# Patient Record
Sex: Female | Born: 1975 | Race: White | Hispanic: No | Marital: Married | Smoking: Never smoker
Health system: Southern US, Community
[De-identification: ages and names within clinical notes are randomized; demographics above are authoritative.]

## PROBLEM LIST (undated history)

## (undated) DIAGNOSIS — F419 Anxiety disorder, unspecified: Secondary | ICD-10-CM

## (undated) DIAGNOSIS — F909 Attention-deficit hyperactivity disorder, unspecified type: Secondary | ICD-10-CM

## (undated) DIAGNOSIS — I499 Cardiac arrhythmia, unspecified: Secondary | ICD-10-CM

## (undated) HISTORY — PX: ABDOMINAL HYSTERECTOMY: SHX81

## (undated) HISTORY — PX: WISDOM TOOTH EXTRACTION: SHX21

## (undated) HISTORY — DX: Attention-deficit hyperactivity disorder, unspecified type: F90.9

## (undated) HISTORY — DX: Anxiety disorder, unspecified: F41.9

## (undated) HISTORY — PX: TOTAL ABDOMINAL HYSTERECTOMY: SHX209

---

## 2004-01-22 ENCOUNTER — Other Ambulatory Visit: Admission: RE | Admit: 2004-01-22 | Discharge: 2004-01-22 | Payer: Self-pay | Admitting: Obstetrics and Gynecology

## 2004-09-03 ENCOUNTER — Ambulatory Visit (HOSPITAL_COMMUNITY): Admission: RE | Admit: 2004-09-03 | Discharge: 2004-09-03 | Payer: Self-pay | Admitting: Obstetrics and Gynecology

## 2005-02-01 ENCOUNTER — Inpatient Hospital Stay (HOSPITAL_COMMUNITY): Admission: AD | Admit: 2005-02-01 | Discharge: 2005-02-01 | Payer: Self-pay | Admitting: Obstetrics & Gynecology

## 2005-04-19 ENCOUNTER — Inpatient Hospital Stay (HOSPITAL_COMMUNITY): Admission: AD | Admit: 2005-04-19 | Discharge: 2005-04-20 | Payer: Self-pay | Admitting: Obstetrics and Gynecology

## 2005-04-20 ENCOUNTER — Inpatient Hospital Stay (HOSPITAL_COMMUNITY): Admission: AD | Admit: 2005-04-20 | Discharge: 2005-04-22 | Payer: Self-pay | Admitting: Obstetrics and Gynecology

## 2005-04-20 ENCOUNTER — Encounter (INDEPENDENT_AMBULATORY_CARE_PROVIDER_SITE_OTHER): Payer: Self-pay | Admitting: Specialist

## 2006-10-15 IMAGING — US US FETAL BPP W/O NONSTRESS
1 series · 14 of 20 positions shown · non-contrast
Comparison: none

CLINICAL DATA: 26 weeks and 5 days pregnant with no detectable fetal movement for the past 2.5 days.

[Series 1: us fetal bpp w/o nonstress · 0.33mm/px · 14 of 20 slices shown]
[im 1/20]
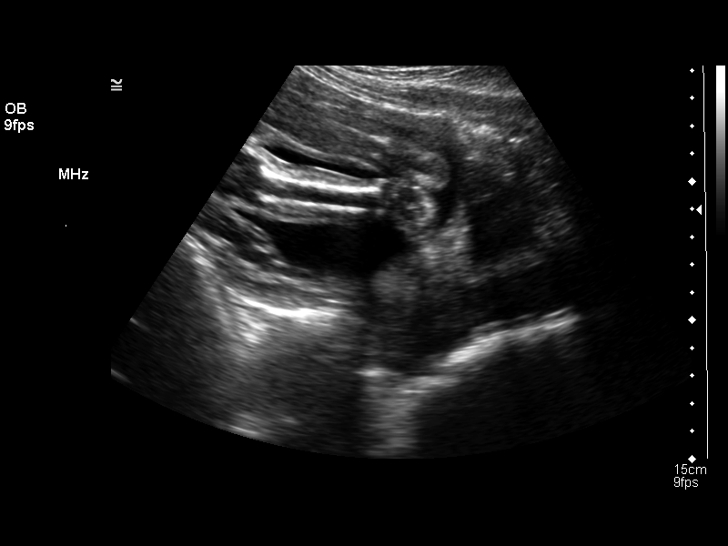
[im 3/20]
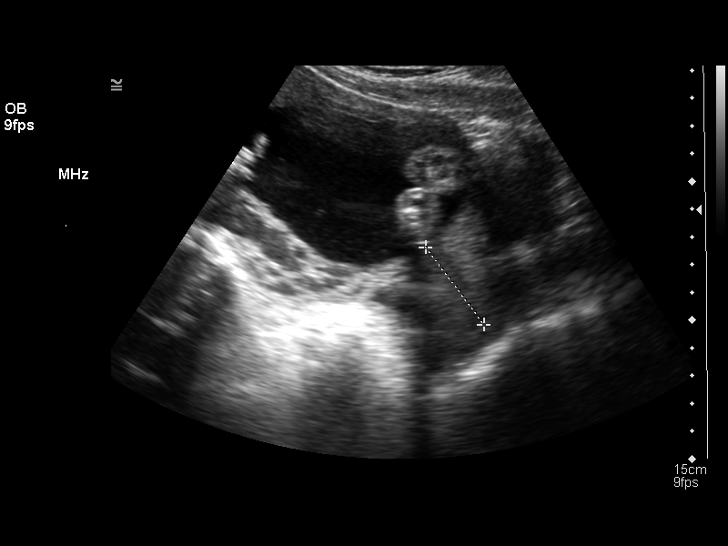
[im 4/20]
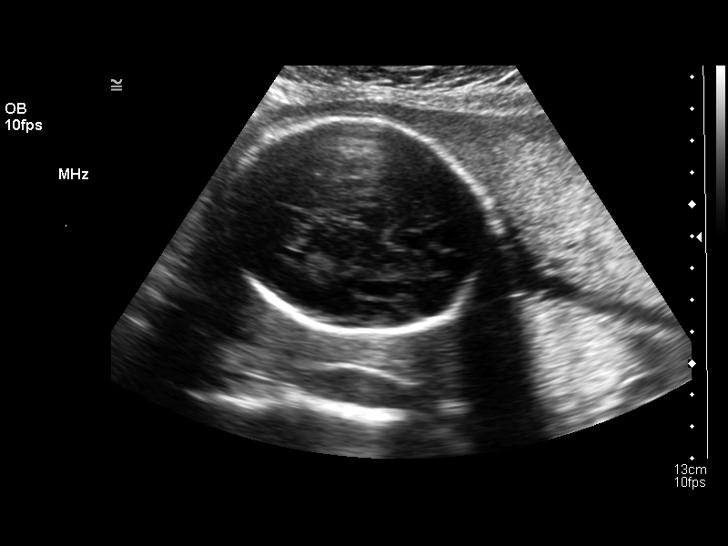
[im 6/20]
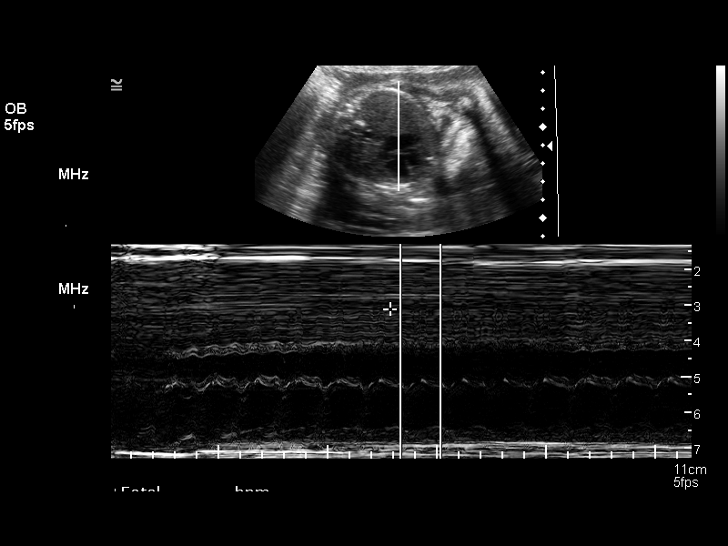
[im 7/20]
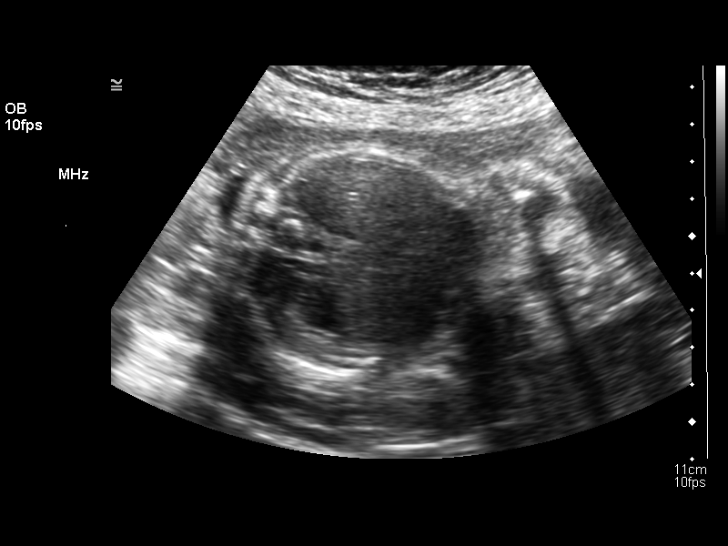
[im 8/20]
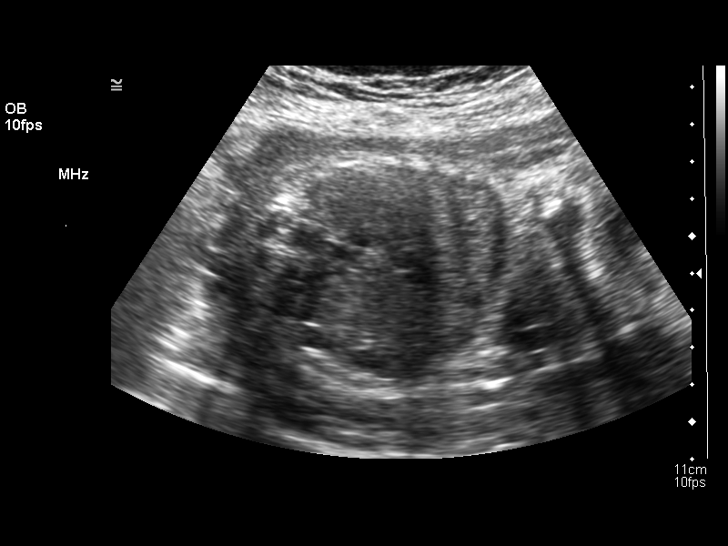
[im 10/20]
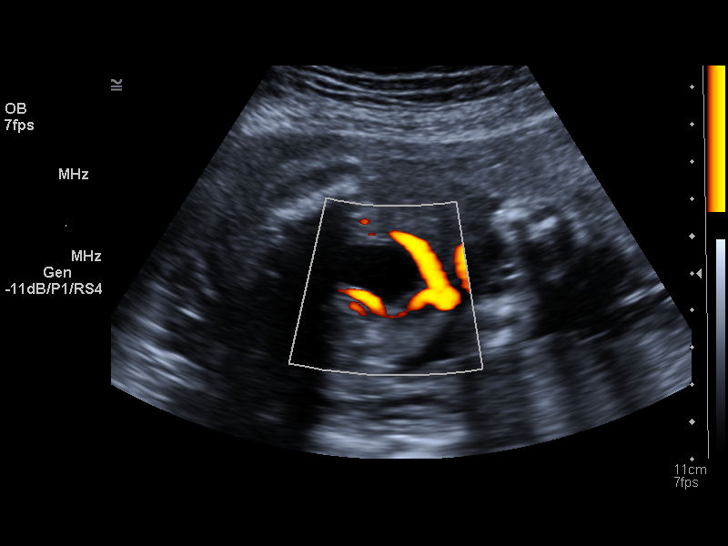
[im 11/20]
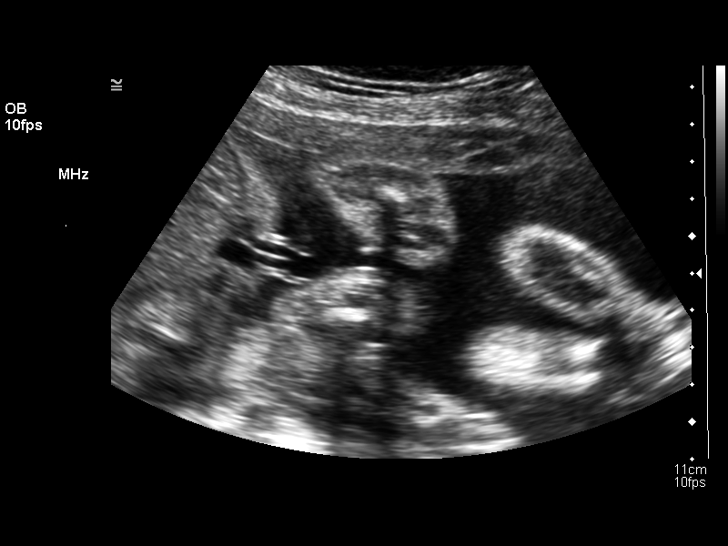
[im 13/20]
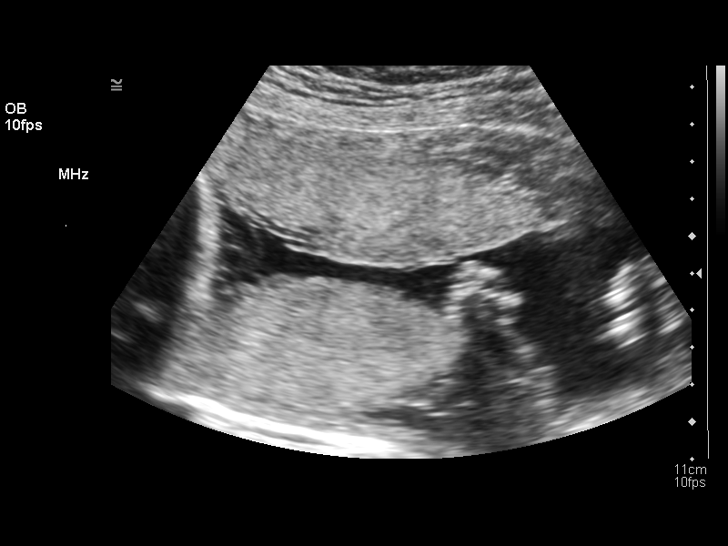
[im 14/20]
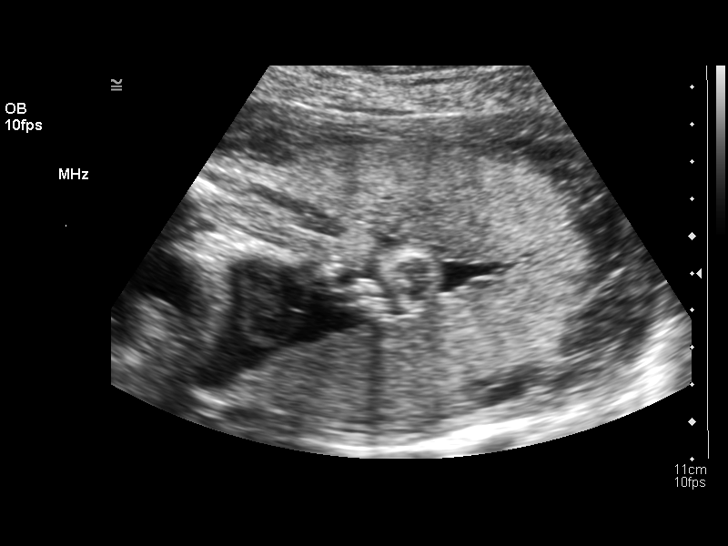
[im 16/20]
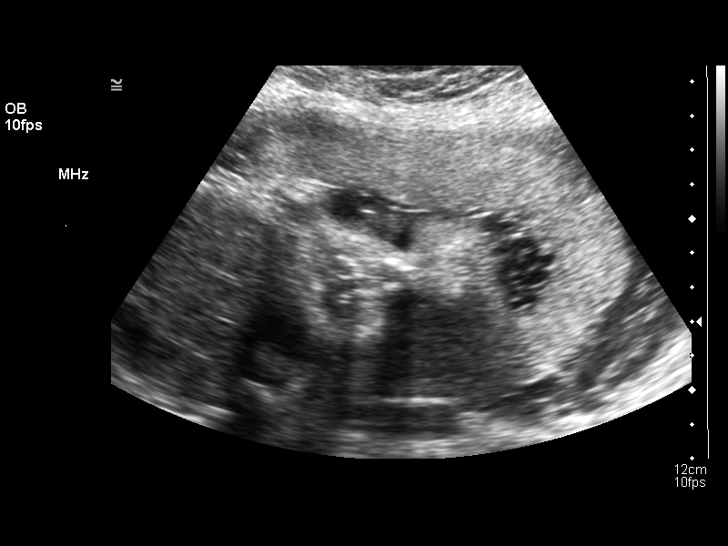
[im 17/20]
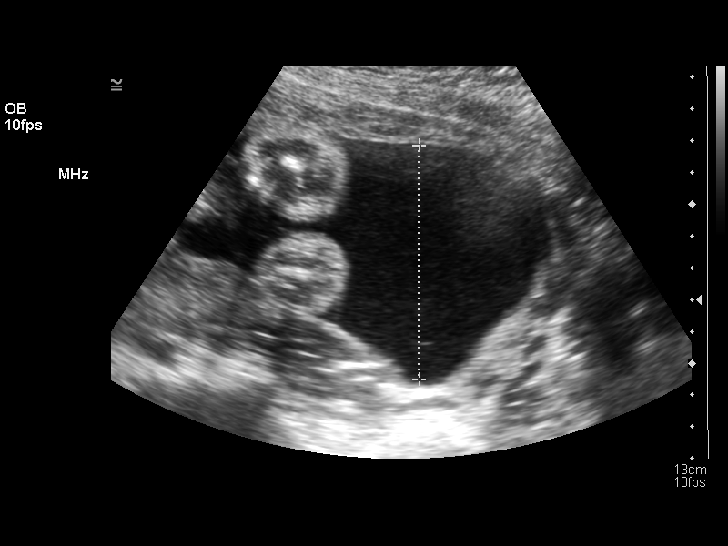
[im 18/20]
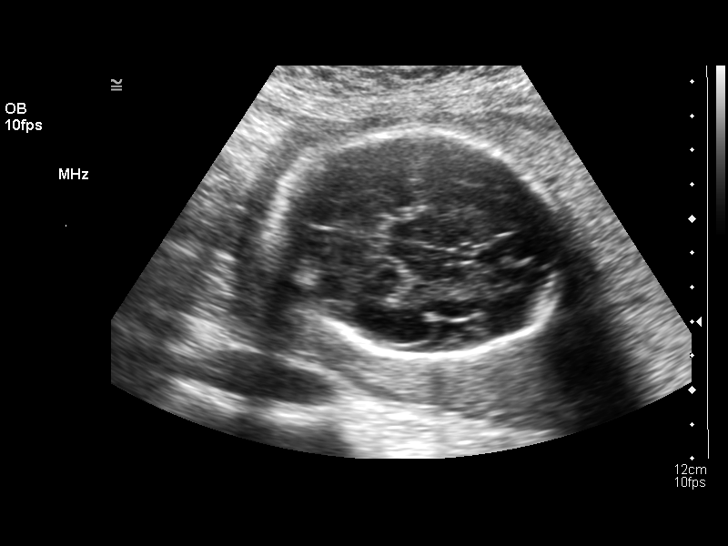
[im 20/20]
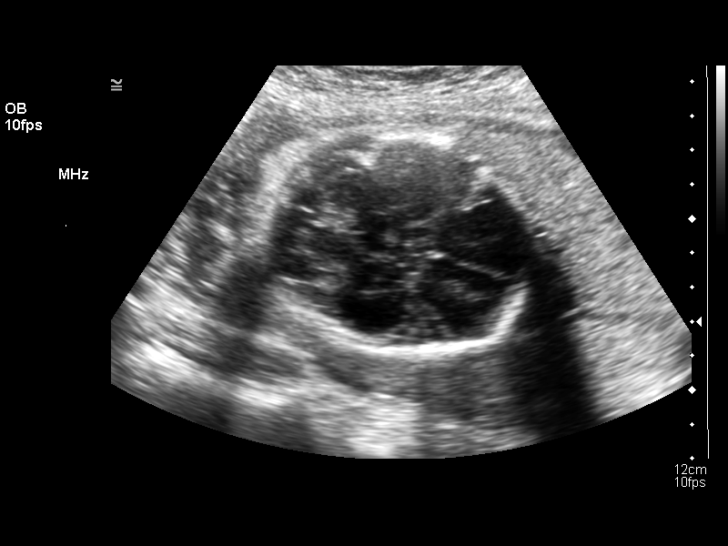

[14 of 20 positions shown; findings below may reference images not displayed]

BIOPHYSICAL PROFILE

 Number of Fetuses:  1
 Heart rate:  160 bpm
 Movement:  Yes 
 Breathing:  No 
 Presentation:  Breech 
 Placental Location:  Fundal
 Grade:  1
 Previa:  No
 Amniotic Fluid (Subjective):  Normal
 Amniotic Fluid (Objective):  Vertical pocket 7.4 cm 

 Fetal measurements and complete anatomic evaluation were not requested.  The following fetal anatomy was visualized on this exam:  Lateral ventricles, thalami, posterior fossa, four chamber heart, stomach on the left, three vessel cord, cord insertion site, kidneys and bladder.   

 BPP SCORING
 Movements:  2
 Breathing:  0
 Tone:  2
 Amniotic Fluid:  2
 Total Score:  [DATE]

 MATERNAL UTERINE AND ADNEXAL FINDINGS
 Cervix:  3.5 cm transabdominally
IMPRESSION: Single live intrauterine gestation in a breech presentation with a biophysical profile score [DATE] at 30 minutes.   A 0 was assigned for lack of sufficient breathing motion.

## 2007-07-04 ENCOUNTER — Inpatient Hospital Stay (HOSPITAL_COMMUNITY): Admission: AD | Admit: 2007-07-04 | Discharge: 2007-07-08 | Payer: Self-pay | Admitting: Obstetrics and Gynecology

## 2007-07-05 ENCOUNTER — Encounter (INDEPENDENT_AMBULATORY_CARE_PROVIDER_SITE_OTHER): Payer: Self-pay | Admitting: Obstetrics and Gynecology

## 2010-01-23 ENCOUNTER — Emergency Department (HOSPITAL_COMMUNITY): Admission: EM | Admit: 2010-01-23 | Discharge: 2010-01-23 | Payer: Self-pay | Admitting: Emergency Medicine

## 2010-01-23 ENCOUNTER — Observation Stay (HOSPITAL_COMMUNITY): Admission: EM | Admit: 2010-01-23 | Discharge: 2010-01-23 | Payer: Self-pay | Admitting: Emergency Medicine

## 2010-11-06 ENCOUNTER — Other Ambulatory Visit: Payer: Self-pay | Admitting: Obstetrics and Gynecology

## 2010-11-24 LAB — URINALYSIS, ROUTINE W REFLEX MICROSCOPIC
Bilirubin Urine: NEGATIVE
Glucose, UA: NEGATIVE mg/dL
Hgb urine dipstick: NEGATIVE
Ketones, ur: 15 mg/dL — AB
Nitrite: NEGATIVE
Protein, ur: NEGATIVE mg/dL
Specific Gravity, Urine: 1.011 (ref 1.005–1.030)
Urobilinogen, UA: 0.2 mg/dL (ref 0.0–1.0)
pH: 7.5 (ref 5.0–8.0)

## 2010-11-24 LAB — CBC
HCT: 39.5 % (ref 36.0–46.0)
Hemoglobin: 13.7 g/dL (ref 12.0–15.0)
MCHC: 34.7 g/dL (ref 30.0–36.0)
MCV: 92.3 fL (ref 78.0–100.0)
Platelets: 236 10*3/uL (ref 150–400)
RBC: 4.28 MIL/uL (ref 3.87–5.11)
RDW: 13.3 % (ref 11.5–15.5)
WBC: 7.3 10*3/uL (ref 4.0–10.5)

## 2010-11-24 LAB — DIFFERENTIAL
Basophils Absolute: 0 10*3/uL (ref 0.0–0.1)
Basophils Relative: 0 % (ref 0–1)
Eosinophils Absolute: 0 10*3/uL (ref 0.0–0.7)
Eosinophils Relative: 1 % (ref 0–5)
Lymphocytes Relative: 21 % (ref 12–46)
Lymphs Abs: 1.6 10*3/uL (ref 0.7–4.0)
Monocytes Absolute: 0.6 10*3/uL (ref 0.1–1.0)
Monocytes Relative: 8 % (ref 3–12)
Neutro Abs: 5.1 10*3/uL (ref 1.7–7.7)
Neutrophils Relative %: 70 % (ref 43–77)

## 2010-11-24 LAB — BASIC METABOLIC PANEL
BUN: 9 mg/dL (ref 6–23)
CO2: 27 mEq/L (ref 19–32)
Calcium: 9.3 mg/dL (ref 8.4–10.5)
Chloride: 108 mEq/L (ref 96–112)
Creatinine, Ser: 0.71 mg/dL (ref 0.4–1.2)
GFR calc Af Amer: >60 mL/min (ref >60)
GFR calc non Af Amer: >60 mL/min (ref >60)
Glucose, Bld: 98 mg/dL (ref 70–99)
Potassium: 4.1 mEq/L (ref 3.5–5.1)
Sodium: 140 mEq/L (ref 135–145)

## 2010-11-24 LAB — GLUCOSE, CAPILLARY: Glucose-Capillary: 91 mg/dL (ref 70–99)

## 2011-01-20 NOTE — Op Note (Signed)
Catherine Camacho, Catherine Camacho              ACCOUNT NO.:  [id number]   MEDICAL RECORD NO.:  [id number]          PATIENT TYPE:  INP   LOCATION:  9374                          [facility]ILITY:  [facility]   PHYSICIAN:  Maxie Better, M.D.DATE OF BIRTH:  1976-05-31   DATE OF PROCEDURE:  07/05/2007  DATE OF DISCHARGE:                               OPERATIVE REPORT   PREOPERATIVE DIAGNOSIS:  Status post vaginal delivery, retained  placenta, placenta accreta.   PROCEDURE:  Failed manual extraction of placenta, exploratory  laparotomy, supracervical postpartum hysterectomy.   POSTOPERATIVE DIAGNOSIS:  Placenta accreta, retained placenta, status  post spontaneous vaginal delivery.   ANESTHESIA:  Epidural.   SURGEON:  Maxie Better, M.D.   ASSISTANT:  Marlinda Mike, C.N.M/ Floyde Parkins M.D.   INDICATION:  This is a 35 year old gravida 2, para 2 married white  female status post uncomplicated vaginal delivery with intact perineum  who was found to have retained placenta not responsive to manual  extraction.  Nitroglycerin was utilized to relax uterus.  The patient  subsequently had episode of hypotension for which ephedrine was given as  well as fluid boluses.  The patient was transferred to the operating  room after it was evident that on bimanual examination and exploration  of the uterine cavity there was six piece of the placenta that was  fundal and posterior that was not able to be removed manually.  At that  point the patient had already lost about 2 liters of blood.  The  placenta had been in for at least an hour prior to attempt at trying to  remove it without success.  The patient and her husband were apprised of  the need for her to be taken back to the operating room for manual  extraction, D&C with possible hysterectomy.  CBC and old clot had been  obtained.  The patient was started on Ancef antibiotics transferred to  the operating room.   PROCEDURE:  In the operating room, the  patient's epidural had been  further dosed.  She was given some sedation.  The patient was then  sterilely prepped including the vagina were large amount of clots was  then also removed.  An indwelling Foley catheter was sterilely placed  and the patient was draped.  Reattempted bimanual extraction of the  placenta was unsuccessful and in particular despite counter traction and  trying to remove the placenta, the uterus kept being pulled down with  inclination to invert on itself.  At that point, decision was then made  to abort the attempt to remove this placenta and proceed with the  hysterectomy.  The patient was then reprepped, draped for hysterectomy.  She remained in the dorsal lithotomy position.  Pfannenstiel skin  incision was made after 0.25% Marcaine was injected, carried down to the  rectus fascia.  Rectus fascia was opened transversely.  Rectus fascia  was then bluntly and sharply dissected off the rectus muscle in superior  and inferior fashion.  The rectus muscles split in midline.  Parietal  peritoneum was entered sharply.  The uterus was then exteriorized.  Other than being recently  gravid uterus, no additional findings was  noted.  Both tubes and ovaries were noted to be normal.  The  right  round ligaments was suture ligated with 0 Vicryl x2 and severed with  cautery.  The anterior leaf of the broad ligament on the right  was  opened.  The vesicouterine peritoneum was opened transversely.  The  bladder was then sharply dissected off the lower uterine segment  displaced inferiorly.  The posterior leaf of the broad ligament on the  right was opened and the right utero-ovarian ligament was then doubly  clamped, cut, suture ligated with 0 Vicryl and free tied with 0 Vicryl  suture.  Dr. Rosalio Macadamia had entered the case.  The left side was equally  performed where the round ligament was identified, suture-ligated,  severed.  The posterior leaf of the broad ligament was opened  on the  left and the left utero-ovarian ligaments was then doubly clamped, cut,  suture ligated and free tied with 0 Vicryl suture.  The uterine vessels  were then skeletonized.  Uterine vessel on the right was then doubly  clamped, back bleeding was accomplished with Kocher and the uterine  vessel was then severed.  Uterine vessel was then suture ligated with 0  Vicryl x2.  Similar procedure was performed on the left also getting the  uterine vessels after skeletonization.  At that point the uterus with  the placenta was then severed from its attachment above the uterine  pedicle.  The placenta was also fully then pulled along with the uterus  and was sent off.  The decision was made to perform a supracervical as  the delineation between the cervix and the vagina had been obliterated  by the fact that the patient had just had a recent delivery.  The  additional lower uterine segment was removed in order to facilitate and  minimize the chances of cyclical bleeding from the procedure and  therefore, small clamping of the cardinal ligaments was done  bilaterally, cut and suture ligated with 0 Vicryl.  Portion of the lower  uterine segment was circumferentially removed.  The angles of the  vaginal cuff was then secured with 0 Vicryl sutures bilaterally and the  top of the remaining portion of the cervix was then whipstitched with 2-  0 Vicryl and then closed with imbricating 0 Vicryl suture.  Additional  sutures were placed for hemostasis and good hemostasis was noted.  The  anterior vesicouterine peritoneum was brought over the cervical stump  and was then irrigated and suctioned of debris.  The right ovary and  tube were suspended to the right round ligament.  The left round  ligament and tube was not suspended.  Abdomen was then copiously  irrigated, suctioned of debris.  When good hemostasis was then noted the  rectus fascia was closed with 0 Vicryl x2.  The subcutaneous areas   irrigated, small bleeders cauterized.  Interrupted 2-0 plain sutures  were then placed and skin was approximated with Ethicon staples.  Intraoperatively, the patient had a hemoglobin obtained which was 7.  Decision was then made to transfuse the patient at that time at least 2  units of packed red cells and this was accomplished.  The urine was  noted to be amber color  otherwise unremarkable.  Intraoperatively the  patient received 2 units of packed red cells, 6300 mL crystalloid.  Urine output was 300 mL.  Specimen sent to pathology was the  fundus/upper body of the uterus with  placenta.  Estimated blood loss  intraoperatively was about 2 liters including the attempt at removing  the placenta.  Complication: None. The patient tolerated the procedure, was transferred  to the recovery room in stable condition.      Maxie Better, M.D.  Electronically Signed     [place]/MEDQ  D:  07/05/2007  T:  07/06/2007  Job:  [id number]

## 2011-01-23 NOTE — Discharge Summary (Signed)
Catherine Camacho, Catherine Camacho              ACCOUNT NO.:  [id number]   MEDICAL RECORD NO.:  [id number]          PATIENT TYPE:  INP   LOCATION:  9131                          [facility]ILITY:  [facility]   PHYSICIAN:  Maxie Better, M.D.DATE OF BIRTH:  23-Nov-1975   DATE OF ADMISSION:  07/04/2007  DATE OF DISCHARGE:  07/08/2007                               DISCHARGE SUMMARY   ADMISSION DIAGNOSES:  1. Spontaneous rupture of membranes.  2. Term gestation.   DISCHARGE DIAGNOSES:  1. Term gestation, delivered.  2. Retained placenta, placenta accreta.  3. Acute blood loss, status post spontaneous vaginal delivery.   PROCEDURE:  Supracervical hysterectomy postpartum and blood  transfusions.   HISTORY OF PRESENT ILLNESS:  This is a 31-year gravida 2, para 1-0-0-1  female at 39-4/7 weeks admitted with spontaneous rupture of membranes.  The patient had an uncomplicated prenatal course.   HOSPITAL COURSE:  The patient was admitted.  Her group B strep was  negative.  She was started on low-dose Pitocin for augmentation of her  labor.  She had epidural for pain management.  She subsequently  progressed well and had delivery of a live female over an intact perineum.  Cord around the neck x 1 was not reducible.  It was clamped and cut. The  placenta was retained for almost an hour or more.  Attempted manual  removal was unsuccessful. Nitroglycerin sublingually was given for  uterine relaxation and still did not facilitate removal of the placenta.  IV Ancef was started.  The patient and husband was advised of the need  to be transferred to the operating room to try to remove the placenta  with the possibility of a hysterectomy.  On taking the patient to the  operating room, the placenta still was not amenable for removal.  She  had also continued blood loss and the decision was made to proceed with  a hysterectomy.  The patient was awake and was advised of that.  Her  epidural was continued to be utilized for  her pain management.  The  patient underwent a supracervical hysterectomy.  Postoperatively in the  PACU, she had hypotension, tachycardia and blood transfusions which had  been started in the operating room were continued in the recovery room.  She was placed in the intensive care unit for close monitoring due to  her condition.  Her estimated blood loss had been 2 liters.  She was  followed with serial CBC and she finally had a hemoglobin up to 10.2  after about 4 units of blood.  Her hematocrit is 29.3, white count was  19, platelet count 142,000.  The patient was continued on antibiotics  postoperatively.  Pathology revealed placenta accreta and a postpartum  uterus.  The specifics from the pathology are not in the chart for  dictation as yet.   The patient did extraordinarily well postoperatively.  She was able to  breast feed.  The weight of the baby was 7 pounds 7 ounces with Apgars  of 8 and 8.  By postop day number 3, she was tolerating a regular diet.  She had been  transferred out of the intensive care unit after she was  continued to do well after 24 hours.  There is some erythema  thought to  be noted at the incision on postop day number 3, but the patient has  remained afebrile.  Keflex was empirically started for possible early  wound infection.  She was otherwise deemed well to be discharged home.   DISPOSITION:  Home.   CONDITION:  Stable.   DISCHARGE MEDICATIONS:  1. Tylox 1-2 tablets every 3-4 hours p.r.n. pain.  2. Iron supplementation 1 p.o. t.i.d.  3. Prenatal vitamins 1 p.o. daily.   FOLLOW UP:  Appointment at [facility] for Tuesday for an incision  check and for 6 weeks postpartum.   DISCHARGE INSTRUCTIONS:  1. Postpartum booklet was given for discharge instructions as was      instructions to call for temperature greater than or equal to      100.4.  2. Nothing per vagina for 4-6 weeks.  3. No heavy lifting or driving for 2 weeks.  4. Call for  increased incisional pain, drainage or redness from the      incision site.      Maxie Better, M.D.  Electronically Signed     [place]/MEDQ  D:  08/02/2007  T:  08/02/2007  Job:  [id number]

## 2011-06-17 LAB — DIFFERENTIAL
Basophils Absolute: 0
Basophils Absolute: 0
Basophils Relative: 0
Basophils Relative: 0
Eosinophils Absolute: 0
Eosinophils Absolute: 0
Eosinophils Relative: 0
Eosinophils Relative: 0
Lymphocytes Relative: 7 — ABNORMAL LOW
Lymphocytes Relative: 9 — ABNORMAL LOW
Lymphs Abs: 1.4
Lymphs Abs: 1.7
Monocytes Absolute: 1.4 — ABNORMAL HIGH
Monocytes Absolute: 1.6 — ABNORMAL HIGH
Monocytes Relative: 7
Monocytes Relative: 8
Neutro Abs: 15.7 — ABNORMAL HIGH
Neutro Abs: 17.7 — ABNORMAL HIGH
Neutrophils Relative %: 83 — ABNORMAL HIGH
Neutrophils Relative %: 86 — ABNORMAL HIGH

## 2011-06-17 LAB — CROSSMATCH
ABO/RH(D): O POS
Antibody Screen: NEGATIVE

## 2011-06-17 LAB — CBC
HCT: 21.3 — ABNORMAL LOW
HCT: 29.3 — ABNORMAL LOW
HCT: 31.7 — ABNORMAL LOW
HCT: 35.3 — ABNORMAL LOW
HCT: 35.4 — ABNORMAL LOW
Hemoglobin: 10.2 — ABNORMAL LOW
Hemoglobin: 11.1 — ABNORMAL LOW
Hemoglobin: 12.1
Hemoglobin: 12.4
Hemoglobin: 7.4 — CL
MCHC: 34.3
MCHC: 34.8
MCHC: 34.9
MCHC: 35
MCHC: 35.2
MCV: 89.9
MCV: 90.9
MCV: 95.6
MCV: 96.2
MCV: 96.4
Platelets: 142 — ABNORMAL LOW
Platelets: 154
Platelets: 157
Platelets: 213
Platelets: 241
RBC: 2.21 — ABNORMAL LOW
RBC: 3.23 — ABNORMAL LOW
RBC: 3.29 — ABNORMAL LOW
RBC: 3.69 — ABNORMAL LOW
RBC: 3.94
RDW: 13.3
RDW: 13.3
RDW: 13.3
RDW: 16.7 — ABNORMAL HIGH
RDW: 17.1 — ABNORMAL HIGH
WBC: 12.2 — ABNORMAL HIGH
WBC: 19 — ABNORMAL HIGH
WBC: 20.5 — ABNORMAL HIGH
WBC: 20.8 — ABNORMAL HIGH
WBC: 21.6 — ABNORMAL HIGH

## 2011-06-17 LAB — PREPARE RBC (CROSSMATCH)

## 2011-06-17 LAB — BASIC METABOLIC PANEL
BUN: 5 — ABNORMAL LOW
CO2: 23
Calcium: 7.7 — ABNORMAL LOW
Chloride: 107
Creatinine, Ser: 0.69
GFR calc Af Amer: >60
GFR calc non Af Amer: >60
Glucose, Bld: 272 — ABNORMAL HIGH
Potassium: 3.8
Sodium: 135

## 2011-06-17 LAB — ABO/RH: ABO/RH(D): O POS

## 2011-06-17 LAB — RPR: RPR Ser Ql: NONREACTIVE

## 2015-09-05 LAB — LIPID PANEL
CHOLESTEROL: 203 mg/dL — AB (ref 0–200)
HDL: 63 mg/dL (ref 35–70)
LDL Cholesterol: 120 mg/dL
TRIGLYCERIDES: 98 mg/dL (ref 40–160)

## 2015-11-15 LAB — HM MAMMOGRAPHY: HM Mammogram: NORMAL (ref 0–4)

## 2015-11-15 LAB — HM PAP SMEAR

## 2016-04-24 ENCOUNTER — Encounter: Payer: Self-pay | Admitting: General Practice

## 2016-07-17 ENCOUNTER — Ambulatory Visit (INDEPENDENT_AMBULATORY_CARE_PROVIDER_SITE_OTHER): Payer: Managed Care, Other (non HMO) | Admitting: Family Medicine

## 2016-07-17 ENCOUNTER — Encounter: Payer: Self-pay | Admitting: Family Medicine

## 2016-07-17 DIAGNOSIS — F329 Major depressive disorder, single episode, unspecified: Secondary | ICD-10-CM | POA: Insufficient documentation

## 2016-07-17 DIAGNOSIS — F419 Anxiety disorder, unspecified: Principal | ICD-10-CM

## 2016-07-17 DIAGNOSIS — F418 Other specified anxiety disorders: Secondary | ICD-10-CM | POA: Diagnosis not present

## 2016-07-17 DIAGNOSIS — F32A Depression, unspecified: Secondary | ICD-10-CM | POA: Insufficient documentation

## 2016-07-17 NOTE — Patient Instructions (Addendum)
Follow up as scheduled for your physical Continue the Trintellix- it sounds like it's working Add a daily Ca and Vit D supplement Mucinex DM for cough/congestion Drink plenty of fluids while not feeling well Call with any questions or concerns Welcome!  We're glad to have you!!!

## 2016-07-17 NOTE — Progress Notes (Signed)
Pre visit review using our clinic review tool, if applicable. No additional management support is needed unless otherwise documented below in the visit note. 

## 2016-07-17 NOTE — Assessment & Plan Note (Signed)
Pt just started her Trintellix daily and has already noticed some improvement.  Reports better sleep, less on edge.  Encouraged her to continue medication daily and we will follow up at her scheduled CPE.  Pt expressed understanding and is in agreement w/ plan.

## 2016-07-17 NOTE — Progress Notes (Signed)
   Subjective:    Patient ID: Catherine Camacho, female    DOB: 1975/11/05, 40 y.o.   MRN: [id number]  HPI New to establish.  Previous MD- Inda Merlin at [place], [place].  UTD on mammo.  Anxiety- chronic problem, currently on Trintellix.  Had bad reaction to Lexapro, it made her numb.  Pt just started this med on Sunday from Dr Haroldine Laws for anxiety which worsens her ADHD.  Sleeping well.  Reports feeling less on edge than previously.  Taking Juice Plus.   Review of Systems For ROS see HPI     Objective:   Physical Exam  Constitutional: She is oriented to person, place, and time. She appears well-developed and well-nourished. No distress.  HENT:  Head: Normocephalic and atraumatic.  Eyes: Conjunctivae and EOM are normal. Pupils are equal, round, and reactive to light.  Neck: Normal range of motion. Neck supple. No thyromegaly present.  Cardiovascular: Normal rate, regular rhythm, normal heart sounds and intact distal pulses.   No murmur heard. Pulmonary/Chest: Effort normal and breath sounds normal. No respiratory distress.  Abdominal: Soft. She exhibits no distension. There is no tenderness.  Musculoskeletal: She exhibits no edema.  Lymphadenopathy:    She has no cervical adenopathy.  Neurological: She is alert and oriented to person, place, and time.  Skin: Skin is warm and dry.  Psychiatric: She has a normal mood and affect. Her behavior is normal.  Vitals reviewed.         Assessment & Plan:

## 2016-09-03 ENCOUNTER — Encounter: Payer: Self-pay | Admitting: Family Medicine

## 2016-09-03 ENCOUNTER — Ambulatory Visit (INDEPENDENT_AMBULATORY_CARE_PROVIDER_SITE_OTHER): Payer: Managed Care, Other (non HMO) | Admitting: Family Medicine

## 2016-09-03 DIAGNOSIS — Z0000 Encounter for general adult medical examination without abnormal findings: Secondary | ICD-10-CM | POA: Diagnosis not present

## 2016-09-03 LAB — BASIC METABOLIC PANEL
BUN: 18 mg/dL (ref 6–23)
CHLORIDE: 105 meq/L (ref 96–112)
CO2: 30 meq/L (ref 19–32)
Calcium: 9 mg/dL (ref 8.4–10.5)
Creatinine, Ser: 0.78 mg/dL (ref 0.40–1.20)
GFR: 85.86 mL/min (ref >60.00)
GLUCOSE: 105 mg/dL — AB (ref 70–99)
POTASSIUM: 4.4 meq/L (ref 3.5–5.1)
Sodium: 138 mEq/L (ref 135–145)

## 2016-09-03 LAB — HEPATIC FUNCTION PANEL
ALBUMIN: 4.1 g/dL (ref 3.5–5.2)
ALK PHOS: 48 U/L (ref 39–117)
ALT: 25 U/L (ref 0–35)
AST: 20 U/L (ref 0–37)
Bilirubin, Direct: 0.1 mg/dL (ref 0.0–0.3)
TOTAL PROTEIN: 6.5 g/dL (ref 6.0–8.3)
Total Bilirubin: 0.6 mg/dL (ref 0.2–1.2)

## 2016-09-03 LAB — CBC WITH DIFFERENTIAL/PLATELET
BASOS ABS: 0 10*3/uL (ref 0.0–0.1)
Basophils Relative: 0.8 % (ref 0.0–3.0)
Eosinophils Absolute: 0.1 10*3/uL (ref 0.0–0.7)
Eosinophils Relative: 1.8 % (ref 0.0–5.0)
HCT: 38.9 % (ref 36.0–46.0)
Hemoglobin: 13.5 g/dL (ref 12.0–15.0)
LYMPHS ABS: 3.2 10*3/uL (ref 0.7–4.0)
Lymphocytes Relative: 50.6 % — ABNORMAL HIGH (ref 12.0–46.0)
MCHC: 34.6 g/dL (ref 30.0–36.0)
MCV: 92.3 fl (ref 78.0–100.0)
MONO ABS: 0.7 10*3/uL (ref 0.1–1.0)
Monocytes Relative: 11.6 % (ref 3.0–12.0)
NEUTROS PCT: 35.2 % — AB (ref 43.0–77.0)
Neutro Abs: 2.2 10*3/uL (ref 1.4–7.7)
PLATELETS: 246 10*3/uL (ref 150.0–400.0)
RBC: 4.22 Mil/uL (ref 3.87–5.11)
RDW: 13.9 % (ref 11.5–15.5)
WBC: 6.4 10*3/uL (ref 4.0–10.5)

## 2016-09-03 LAB — LIPID PANEL
CHOLESTEROL: 183 mg/dL (ref 0–200)
HDL: 55 mg/dL (ref >39.00)
LDL Cholesterol: 113 mg/dL — ABNORMAL HIGH (ref 0–99)
NonHDL: 128.19
Total CHOL/HDL Ratio: 3
Triglycerides: 77 mg/dL (ref 0.0–149.0)
VLDL: 15.4 mg/dL (ref 0.0–40.0)

## 2016-09-03 LAB — VITAMIN D 25 HYDROXY (VIT D DEFICIENCY, FRACTURES): VITD: 27.5 ng/mL — AB (ref 30.00–100.00)

## 2016-09-03 LAB — TSH: TSH: 1.54 u[IU]/mL (ref 0.35–4.50)

## 2016-09-03 NOTE — Progress Notes (Signed)
Pre visit review using our clinic review tool, if applicable. No additional management support is needed unless otherwise documented below in the visit note. 

## 2016-09-03 NOTE — Progress Notes (Signed)
   Subjective:    Patient ID: Catherine Camacho, female    DOB: 1975-09-30, 40 y.o.   MRN: [id number]  HPI CPE- UTD on pap, mammo.  UTD on flu, Tdap.  No concerns today.   Review of Systems Patient reports no vision/ hearing changes, adenopathy,fever, weight change,  persistant/recurrent hoarseness , swallowing issues, chest pain, palpitations, edema, persistant/recurrent cough, hemoptysis, dyspnea (rest/exertional/paroxysmal nocturnal), gastrointestinal bleeding (melena, rectal bleeding), abdominal pain, significant heartburn, bowel changes, GU symptoms (dysuria, hematuria, incontinence), Gyn symptoms (abnormal  bleeding, pain),  syncope, focal weakness, memory loss, numbness & tingling, skin/hair/nail changes, abnormal bruising or bleeding, anxiety, or depression.     Objective:   Physical Exam General Appearance:    Alert, cooperative, no distress, appears stated age  Head:    Normocephalic, without obvious abnormality, atraumatic  Eyes:    PERRL, conjunctiva/corneas clear, EOM's intact, fundi    benign, both eyes  Ears:    Normal TM's and external ear canals, both ears  Nose:   Nares normal, septum midline, mucosa normal, no drainage    or sinus tenderness  Throat:   Lips, mucosa, and tongue normal; teeth and gums normal  Neck:   Supple, symmetrical, trachea midline, no adenopathy;    Thyroid: no enlargement/tenderness/nodules  Back:     Symmetric, no curvature, ROM normal, no CVA tenderness  Lungs:     Clear to auscultation bilaterally, respirations unlabored  Chest Wall:    No tenderness or deformity   Heart:    Regular rate and rhythm, S1 and S2 normal, no murmur, rub   or gallop  Breast Exam:    Deferred to GYN  Abdomen:     Soft, non-tender, bowel sounds active all four quadrants,    no masses, no organomegaly  Genitalia:    Deferred to GYN  Rectal:    Extremities:   Extremities normal, atraumatic, no cyanosis or edema  Pulses:   2+ and symmetric all extremities  Skin:    Skin color, texture, turgor normal, no rashes or lesions  Lymph nodes:   Cervical, supraclavicular, and axillary nodes normal  Neurologic:   CNII-XII intact, normal strength, sensation and reflexes    throughout          Assessment & Plan:

## 2016-09-03 NOTE — Patient Instructions (Signed)
Follow up in 1 year or as needed We'll notify you of your lab results and make any changes if needed Continue to work on healthy diet and regular exercise- you look great! Call with any questions or concerns Happy New Year!!! 

## 2016-09-03 NOTE — Assessment & Plan Note (Signed)
Pt's PE WNL.  UTD on GYN, immunizations.  Check labs.  Anticipatory guidance provided.  

## 2016-09-04 ENCOUNTER — Ambulatory Visit: Payer: Self-pay | Admitting: Family Medicine

## 2016-10-28 ENCOUNTER — Encounter: Payer: Self-pay | Admitting: Family Medicine

## 2016-12-14 ENCOUNTER — Telehealth: Payer: Self-pay | Admitting: General Practice

## 2016-12-14 NOTE — Telephone Encounter (Signed)
Left message for patient to call back to discuss.

## 2016-12-14 NOTE — Telephone Encounter (Signed)
You please call pt and see if she would still like Tamiflu sent to pharmacy and ensure no symptoms?    [facility] Patient Name: Catherine Camacho Gender: Female DOB: December 22, 1975 Age: 41 Y 9 M 27 D Return Phone Number: [phone] (Primary) City/State/Zip: [place] [address] Client [place] Night - C Client Site [place] Physician Dimple Nanas - MD Who Is Calling Patient / Member / Family / Caregiver Call Type Triage / Clinical Relationship To Patient Self Return Phone Number [phone] (Primary) Chief Complaint Prescription Refill or Medication Request (non symptomatic) Reason for Call Symptomatic / Request for [place] states her kids tested positive for Flu B; wants to know if she can get a Rx for Tamiflu; no symptoms at this time. Nurse Assessment Nurse: Cherre Robins, RN, [place] Comment Date/Time (Eastern Time): 12/12/2016 2:27:41 PM Confirm and document reason for call. If symptomatic, describe symptoms. ---Caller states that her children tested positive for Flu B and she wants to know if she can get a Rx for Tamiflu. Caller states she has no symptoms at this time. Guidelines Guideline Title Affirmed Question Influenza Exposure Influenza and antiviral drugs, questions about Disp. Time Eilene Ghazi Time) Disposition Final User 12/12/2016 2:32:41 PM Home Care Yes Weiss[place], RN, [facility] Advice Given Per Guideline HOME CARE: You should be able to treat this at home. * Treatment is recommended for HIGH RISK patients (e.g., age over 81 years, pregnant, HIV+, or chronic medical condition) with influenza or any patient with severe symptoms (per CDC). CALL BACK IF: * You have more questions. CARE ADVICE given per INFLUENZA EXPOSURE (Adult) guideline. * Treatment is usually not needed for most  healthy people who have mild or moderate flu symptoms. Most patients recover without taking antiviral medications. Comments User: Carolan Clines, RN Date/Time Eilene Ghazi Time): 12/12/2016 2:33:26 PM RN also advised caller that medications cannot be called in after hours and that she can call the office on Monday when they open during regular business hours if she would like to request tamiflu at that time. Caller voiced understanding.

## 2016-12-15 NOTE — Telephone Encounter (Signed)
Encounter closed until pt returns call.

## 2017-02-13 LAB — HM MAMMOGRAPHY: HM Mammogram: NORMAL

## 2017-03-25 ENCOUNTER — Encounter: Payer: Self-pay | Admitting: Family Medicine

## 2017-04-06 ENCOUNTER — Ambulatory Visit: Payer: Managed Care, Other (non HMO) | Admitting: Physician Assistant

## 2017-04-06 ENCOUNTER — Ambulatory Visit: Payer: Managed Care, Other (non HMO) | Admitting: Family Medicine

## 2017-04-15 ENCOUNTER — Ambulatory Visit (INDEPENDENT_AMBULATORY_CARE_PROVIDER_SITE_OTHER): Payer: Managed Care, Other (non HMO) | Admitting: Family Medicine

## 2017-04-15 ENCOUNTER — Encounter: Payer: Self-pay | Admitting: Family Medicine

## 2017-04-15 ENCOUNTER — Telehealth: Payer: Self-pay | Admitting: General Practice

## 2017-04-15 ENCOUNTER — Ambulatory Visit: Payer: Managed Care, Other (non HMO) | Admitting: Family Medicine

## 2017-04-15 VITALS — BP 121/81 | HR 99 | Temp 98.3°F | Resp 16 | Ht 69.0 in | Wt 154.5 lb

## 2017-04-15 DIAGNOSIS — M79651 Pain in right thigh: Secondary | ICD-10-CM

## 2017-04-15 DIAGNOSIS — M79672 Pain in left foot: Secondary | ICD-10-CM

## 2017-04-15 MED ORDER — MELOXICAM 7.5 MG PO TABS: 7.5000 mg | ORAL_TABLET | Freq: Every day | ORAL | 0 refills | Status: DC

## 2017-04-15 NOTE — Progress Notes (Signed)
   Subjective:    Patient ID: Catherine Camacho, female    DOB: 06-30-1976, 41 y.o.   MRN: [id number]  HPI R thigh pain- pt has 'pinching feeling' in R medial lower thigh that will send radiating pain into lower leg.  sxs are intermittent.  No correlation w/ water intake or exercise.  Recently started new exercise program.  sxs started just this summer.  Has been wearing flip flops.  Will wake her from sleep.  Nothing improves pain- resolves spontaneously.  Nothing worsens sxs.  sxs are occurring daily.  Pt has high anxiety and is tearful today- fears blood clot   Review of Systems For ROS see HPI     Objective:   Physical Exam  Constitutional: She is oriented to person, place, and time. She appears well-developed and well-nourished. No distress.  HENT:  Head: Normocephalic and atraumatic.  Musculoskeletal: She exhibits no edema, tenderness (no TTP over distal thigh on L or R, now redness,swelling, or warmth) or deformity.  Neurological: She is alert and oriented to person, place, and time. She has normal reflexes. No cranial nerve deficit. Coordination normal.  Skin: Skin is warm and dry. No rash noted. No erythema.  Psychiatric:  Tearful, anxious  Vitals reviewed.         Assessment & Plan:  R distal thigh pain- new.  Suspect that her pain is due to a biomechanics issue with her use of flip flops this summer and other poorly supportive shoes.  No swelling, redness, warmth to suggest DVT and no mass.  Start low dose NSAID and refer to sports med.  Reviewed supportive care and red flags that should prompt return.  Pt expressed understanding and is in agreement w/ plan.

## 2017-04-15 NOTE — Telephone Encounter (Signed)
Could you call the patient to find out what concerns she may have?   Per Mychart message: Thanks for seeing me today. I would love to chat with Dr Birdie Riddle about my appointment today sometime...whether it is through this messaging or on the phone. Thank you. Lamount Cohen

## 2017-04-15 NOTE — Patient Instructions (Signed)
Follow up as needed/scheduled We'll call you with your Sports Med appt for the L foot pain and the R thigh pain Start the once daily Meloxicam for inflammation Try and wear supportive footwear if a lot of planned walking (this seems like a biomechanical issue but we'll figure it out) The numbness is positional and thankfully not cause for concern Call with any questions or concerns Hang in there!!!

## 2017-04-15 NOTE — Progress Notes (Signed)
Pre visit review using our clinic review tool, if applicable. No additional management support is needed unless otherwise documented below in the visit note. 

## 2017-04-15 NOTE — Telephone Encounter (Signed)
Left message for patient to call to discuss.  

## 2017-04-16 ENCOUNTER — Encounter: Payer: Self-pay | Admitting: *Deleted

## 2017-04-16 NOTE — Telephone Encounter (Signed)
MyChart message sent to patient regarding questions from office visit.

## 2017-04-19 MED ORDER — ALPRAZOLAM 0.5 MG PO TABS: 0.5000 mg | ORAL_TABLET | Freq: Two times a day (BID) | ORAL | 1 refills | Status: DC | PRN

## 2017-04-27 ENCOUNTER — Encounter: Payer: Self-pay | Admitting: Family Medicine

## 2017-04-27 DIAGNOSIS — M79651 Pain in right thigh: Secondary | ICD-10-CM

## 2017-04-27 DIAGNOSIS — M79672 Pain in left foot: Secondary | ICD-10-CM

## 2017-07-26 ENCOUNTER — Telehealth: Payer: Self-pay | Admitting: Family Medicine

## 2017-07-26 NOTE — Telephone Encounter (Signed)
Copied from [place] [id number]. Topic: Quick Communication - See Telephone Encounter >> Jul 26, 2017 10:50 AM Cleaster Corin, NT wrote: CRM for notification. See Telephone encounter for:   07/26/17. Pt. Had called to reschedule CPE with Dr,. Tabori so I cancelled appt. but the next avable appt. Wasn't until Jan. 16th then she decided to stick with appt. For 09/06/17 tried to go back to schedule and I cant. Please call pt. To let her know if you are able to put her back on schedule

## 2017-07-26 NOTE — Telephone Encounter (Signed)
Copied from [place] [id number]. Topic: Appointment Scheduling - Scheduling Inquiry for Clinic >> Jul 26, 2017 12:19 PM Catherine Camacho, [place] wrote: Reason for CRM:  Patient called and would like an appointment with  Dr Birdie Riddle  does not want to see anyone else pleas call her at [phone] , says she only comes 1 time a year please call with appointment by 09/2016

## 2017-07-26 NOTE — Telephone Encounter (Signed)
Patient's appointment was canceled inappropriately.   [facility] called for approval to put patient back on 12/31.  Patient has been scheduled

## 2017-08-03 ENCOUNTER — Encounter: Payer: Self-pay | Admitting: Family Medicine

## 2017-09-06 ENCOUNTER — Encounter: Payer: Managed Care, Other (non HMO) | Admitting: Family Medicine

## 2017-09-16 ENCOUNTER — Ambulatory Visit (INDEPENDENT_AMBULATORY_CARE_PROVIDER_SITE_OTHER): Payer: Managed Care, Other (non HMO) | Admitting: Family Medicine

## 2017-09-16 ENCOUNTER — Encounter: Payer: Self-pay | Admitting: Family Medicine

## 2017-09-16 ENCOUNTER — Encounter: Payer: Self-pay | Admitting: *Deleted

## 2017-09-16 VITALS — BP 104/60 | HR 82 | Temp 98.1°F | Ht 69.25 in | Wt 154.4 lb

## 2017-09-16 DIAGNOSIS — R5383 Other fatigue: Secondary | ICD-10-CM

## 2017-09-16 DIAGNOSIS — Z0000 Encounter for general adult medical examination without abnormal findings: Secondary | ICD-10-CM

## 2017-09-16 DIAGNOSIS — K219 Gastro-esophageal reflux disease without esophagitis: Secondary | ICD-10-CM | POA: Diagnosis not present

## 2017-09-16 LAB — VITAMIN D 25 HYDROXY (VIT D DEFICIENCY, FRACTURES): VITD: 35.96 ng/mL (ref 30.00–100.00)

## 2017-09-16 LAB — CBC WITH DIFFERENTIAL/PLATELET
BASOS PCT: 0.6 % (ref 0.0–3.0)
Basophils Absolute: 0.1 10*3/uL (ref 0.0–0.1)
EOS PCT: 2.2 % (ref 0.0–5.0)
Eosinophils Absolute: 0.2 10*3/uL (ref 0.0–0.7)
HEMATOCRIT: 42.9 % (ref 36.0–46.0)
HEMOGLOBIN: 14.4 g/dL (ref 12.0–15.0)
LYMPHS PCT: 25.5 % (ref 12.0–46.0)
Lymphs Abs: 2.6 10*3/uL (ref 0.7–4.0)
MCHC: 33.5 g/dL (ref 30.0–36.0)
MCV: 93.8 fl (ref 78.0–100.0)
MONO ABS: 0.9 10*3/uL (ref 0.1–1.0)
MONOS PCT: 9.1 % (ref 3.0–12.0)
Neutro Abs: 6.4 10*3/uL (ref 1.4–7.7)
Neutrophils Relative %: 62.6 % (ref 43.0–77.0)
Platelets: 277 10*3/uL (ref 150.0–400.0)
RBC: 4.57 Mil/uL (ref 3.87–5.11)
RDW: 13.3 % (ref 11.5–15.5)
WBC: 10.3 10*3/uL (ref 4.0–10.5)

## 2017-09-16 LAB — COMPREHENSIVE METABOLIC PANEL
ALT: 11 U/L (ref 0–35)
AST: 12 U/L (ref 0–37)
Albumin: 4.3 g/dL (ref 3.5–5.2)
Alkaline Phosphatase: 50 U/L (ref 39–117)
BUN: 12 mg/dL (ref 6–23)
CALCIUM: 9.1 mg/dL (ref 8.4–10.5)
CHLORIDE: 103 meq/L (ref 96–112)
CO2: 28 mEq/L (ref 19–32)
CREATININE: 0.71 mg/dL (ref 0.40–1.20)
GFR: 95.23 mL/min (ref >60.00)
Glucose, Bld: 101 mg/dL — ABNORMAL HIGH (ref 70–99)
POTASSIUM: 4.1 meq/L (ref 3.5–5.1)
Sodium: 138 mEq/L (ref 135–145)
Total Bilirubin: 0.5 mg/dL (ref 0.2–1.2)
Total Protein: 7.2 g/dL (ref 6.0–8.3)

## 2017-09-16 LAB — LIPID PANEL
CHOLESTEROL: 193 mg/dL (ref 0–200)
HDL: 63.4 mg/dL (ref >39.00)
LDL CALC: 109 mg/dL — AB (ref 0–99)
NonHDL: 129.27
TRIGLYCERIDES: 100 mg/dL (ref 0.0–149.0)
Total CHOL/HDL Ratio: 3
VLDL: 20 mg/dL (ref 0.0–40.0)

## 2017-09-16 LAB — TSH: TSH: 1.93 u[IU]/mL (ref 0.35–4.50)

## 2017-09-16 MED ORDER — OMEPRAZOLE 20 MG PO CPDR
20.0000 mg | DELAYED_RELEASE_CAPSULE | Freq: Every day | ORAL | 3 refills | Status: DC
Start: 2017-09-16 — End: 2018-05-25

## 2017-09-16 NOTE — Progress Notes (Signed)
Subjective  Chief Complaint  Patient presents with  . Annual Exam    Fasting    HPI: Catherine Camacho is a 42 y.o. female who presents to [place] [facility] today for a Female Wellness Visit.   Wellness Visit: annual visit with health maintenance review and exam without Pap; to see GYN for female wellness.   Very pleasant happy healthy 13 yo mother of 2 who is doing well. No chronic medical problems. Does c/o fatigue x 2 years. Always tired. 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 Sleeps about 7 hours/night. Feels rested and better after the weekend. Typically has 16 hour days with little down time. Motivation and mood are good. Mild "burn out" with classroom teaching and wanting to change that so she is a little stressed. No major stressors or physical symptoms. Does admit to heartburn responsive to tums 3x/week. Was exercising which helped but can't find time to fit that in right now. ROS is negative as listed below  URI sxs x 3-4 days with cough, congestion, ST and w/o fever, malaise, n/v or sob.  Lifestyle: Body mass index is 22.63 kg/m. Wt Readings from Last 3 Encounters:  09/16/17 154 lb 6.1 oz (70 kg)  04/15/17 154 lb 8 oz (70.1 kg)  09/03/16 152 lb 2 oz (69 kg)   Diet: low fat Exercise: intermittently, boot camps Need for contraception: No, status post hysterectomy  Patient Active Problem List   Diagnosis Date Noted  . Physical exam 09/03/2016  . Anxiety and depression 07/17/2016   Health Maintenance  Topic Date Due  . HIV Screening  02/15/1989  . MAMMOGRAM  02/13/2018  . TETANUS/TDAP  07/17/2024  . INFLUENZA VACCINE  Completed   Immunization History  Administered Date(s) Administered  . Influenza,inj,Quad PF,6+ Mos 06/16/2016  . Influenza-Unspecified 07/10/2017  . Tdap 07/17/2014   We updated and reviewed the patient's past history in detail and it is documented below. Allergies: Patient is allergic  to lexapro [escitalopram oxalate]. Past Medical History Patient  has a past medical history of ADHD (attention deficit hyperactivity disorder) and Anxiety. Past Surgical History Patient  has a past surgical history that includes Abdominal hysterectomy. Family History: Patient family history includes Depression in her maternal grandmother; Healthy in her daughter, father, mother, and son; Heart disease in her paternal grandfather; Hyperlipidemia in her brother; Hypertension in her brother; Other in her paternal grandfather; Stroke in her maternal grandfather. Social History:  Patient  reports that  has never smoked. she has never used smokeless tobacco. She reports that she drinks alcohol. She reports that she does not use drugs.  Review of Systems: Constitutional: negative for fever or malaise, no sinus pain Ophthalmic: negative for photophobia, double vision or loss of vision Cardiovascular: negative for chest pain, dyspnea on exertion, or new LE swelling Respiratory: negative for SOB or persistent cough Gastrointestinal: negative for abdominal pain, change in bowel habits or melena Genitourinary: negative for dysuria or gross hematuria, no abnormal uterine bleeding or disharge Musculoskeletal: negative for new gait disturbance or muscular weakness Integumentary: negative for new or persistent rashes, no breast lumps Neurological: negative for TIA or stroke symptoms Psychiatric: negative for SI or delusions Allergic/Immunologic: negative for hives Patient Care Team    Relationship Specialty Notifications Start End  Midge Minium, MD PCP - General Family Medicine  07/17/16   Servando Salina, MD Consulting Physician Obstetrics and Gynecology  07/17/16     Objective  Vitals: BP 104/60 (BP Location: Left Arm, Patient Position: Sitting, Cuff Size: Normal)   Pulse 82   Temp 98.1 F (36.7 C) (Oral)   Ht 5' 9.25" (1.759 m)   Wt 154 lb 6.1 oz (70 kg)   SpO2 99%   BMI 22.63 kg/m   General:  Well developed, well nourished, no acute distress  Psych:  Alert and orientedx3,normal mood and affect HEENT:  Normocephalic, atraumatic, non-icteric sclera, PERRL, oropharynx is minimally red without mass or exudate, supple neck with adenopathy, mass or thyromegaly, no sinus ttp Cardiovascular:  Normal S1, S2, RRR without gallop, rub or murmur, nondisplaced PMI Respiratory:  Good breath sounds bilaterally, CTAB with normal respiratory effort Gastrointestinal: normal bowel sounds, soft, non-tender, no noted masses. No HSM MSK: no deformities, contusions. Joints are without erythema or swelling. Spine and CVA region are nontender Skin:  Warm, no rashes or suspicious lesions noted Neurologic:    Mental status is normal. CN 2-11 are normal. Gross motor and sensory exams are normal. Normal gait. No tremor  Assessment  1. Annual physical exam   2. Gastroesophageal reflux disease without esophagitis   3. Fatigue, unspecified type      Plan  Female Wellness Visit:  Age appropriate Health Maintenance and Prevention measures were discussed with patient. Included topics are cancer screening recommendations, ways to keep healthy (see AVS) including dietary and exercise recommendations, regular eye and dental care, use of seat belts, and avoidance of moderate alcohol use and tobacco use. Screens are up to date. mammo with GYN later this year  BMI: discussed patient's BMI and encouraged positive lifestyle modifications to help get to or maintain a target BMI.  HM needs and immunizations were addressed and ordered. See below for orders. See HM and immunization section for updates.  Routine labs and screening tests ordered including cmp, cbc and lipids where appropriate.  Discussed recommendations regarding Vit D and calcium supplementation (see AVS)  Fatigue is likely multifactorial. No organic sxs. Check labs. Counseled on lifestyle mgt, down time, getting enough rest, and exercise. Neg  depression screen.   GERD - 4-6 week course of PPI recommended.   Follow up: 1 year for cpe    Commons side effects, risks, benefits, and alternatives for medications and treatment plan prescribed today were discussed, and the patient expressed understanding of the given instructions. Patient is instructed to call or message via MyChart if he/she has any questions or concerns regarding our treatment plan. No barriers to understanding were identified. We discussed Red Flag symptoms and signs in detail. Patient expressed understanding regarding what to do in case of urgent or emergency type symptoms.   Medication list was reconciled, printed and provided to the patient in AVS. Patient instructions and summary information was reviewed with the patient as documented in the AVS. This note was prepared with assistance of Dragon voice recognition software. Occasional wrong-word or sound-a-like substitutions may have occurred due to the inherent limitations of voice recognition software  Orders Placed This Encounter  Procedures  . CBC with Differential/Platelet  . Comprehensive metabolic panel  . Lipid panel  . HIV antibody  . TSH  . VITAMIN D 25 Hydroxy (Vit-D Deficiency, Fractures)   Meds ordered this encounter  Medications  . omeprazole (PRILOSEC) 20 MG capsule    Sig: Take 1 capsule (20 mg total) by mouth daily.    Dispense:  30 capsule    Refill:  3

## 2017-09-16 NOTE — Patient Instructions (Signed)
Please return in 12 months for your complete physical.  If you have any questions or concerns, please don't hesitate to send me a message via MyChart or call the office at [phone]. Thank you for visiting with [place] today! It's our pleasure caring for you.  Please do these things to maintain good health!   Exercise at least 30-45 minutes a day,  4-5 days a week.   Eat a low-fat diet with lots of fruits and vegetables, up to 7-9 servings per day.  Drink plenty of water daily. Try to drink 8 8oz glasses per day.  Seatbelts can save your life. Always wear your seatbelt.  Place Smoke Detectors on every level of your home and check batteries every year.  Schedule an appointment with an eye doctor for an eye exam every 1-2 years  Safe sex - use condoms to protect yourself from STDs if you could be exposed to these types of infections. Use birth control if you do not want to become pregnant and are sexually active.  Avoid heavy alcohol use. If you drink, keep it to less than 2 drinks/day and not every day.  [place].  Choose someone you trust that could speak for you if you became unable to speak for yourself.  Depression is common in our stressful world.If you're feeling down or losing interest in things you normally enjoy, please come in for a visit.  If anyone is threatening or hurting you, please get help. Physical or Emotional Violence is never OK.   Heartburn Heartburn is a type of pain or discomfort that can happen in the throat or chest. It is often described as a burning pain. It may also cause a bad taste in the mouth. Heartburn may feel worse when you lie down or bend over, and it is often worse at night. Heartburn may be caused by stomach contents that move back up into the esophagus (reflux). Follow these instructions at home: Take these actions to decrease your discomfort and to help avoid complications. Diet  Follow a diet as recommended by your health  care provider. This may involve avoiding foods and drinks such as: ? Coffee and tea (with or without caffeine). ? Drinks that contain alcohol. ? Energy drinks and sports drinks. ? Carbonated drinks or sodas. ? Chocolate and cocoa. ? Peppermint and mint flavorings. ? Garlic and onions. ? Horseradish. ? Spicy and acidic foods, including peppers, chili powder, curry powder, vinegar, hot sauces, and barbecue sauce. ? Citrus fruit juices and citrus fruits, such as oranges, lemons, and limes. ? Tomato-based foods, such as red sauce, chili, salsa, and pizza with red sauce. ? Fried and fatty foods, such as donuts, french fries, potato chips, and high-fat dressings. ? High-fat meats, such as hot dogs and fatty cuts of red and white meats, such as rib eye steak, sausage, ham, and bacon. ? High-fat dairy items, such as whole milk, butter, and cream cheese.  Eat small, frequent meals instead of large meals.  Avoid drinking large amounts of liquid with your meals.  Avoid eating meals during the 2-3 hours before bedtime.  Avoid lying down right after you eat.  Do not exercise right after you eat. General instructions  Pay attention to any changes in your symptoms.  Take over-the-counter and prescription medicines only as told by your health care provider. Do not take aspirin, ibuprofen, or other NSAIDs unless your health care provider told you to do so.  Do not use any tobacco  products, including cigarettes, chewing tobacco, and e-cigarettes. If you need help quitting, ask your health care provider.  Wear loose-fitting clothing. Do not wear anything tight around your waist that causes pressure on your abdomen.  Raise (elevate) the head of your bed about 6 inches (15 cm).  Try to reduce your stress, such as with yoga or meditation. If you need help reducing stress, ask your health care provider.  If you are overweight, reduce your weight to an amount that is healthy for you. Ask your health  care provider for guidance about a safe weight loss goal.  Keep all follow-up visits as told by your health care provider. This is important. Contact a health care provider if:  You have new symptoms.  You have unexplained weight loss.  You have difficulty swallowing, or it hurts to swallow.  You have wheezing or a persistent cough.  Your symptoms do not improve with treatment.  You have frequent heartburn for more than two weeks. Get help right away if:  You have pain in your arms, neck, jaw, teeth, or back.  You feel sweaty, dizzy, or light-headed.  You have chest pain or shortness of breath.  You vomit and your vomit looks like blood or coffee grounds.  Your stool is bloody or black. This information is not intended to replace advice given to you by your health care provider. Make sure you discuss any questions you have with your health care provider. Document Released: 01/10/2009 Document Revised: 01/30/2016 Document Reviewed: 12/19/2014 Elsevier Interactive Patient Education  [employer].

## 2017-09-17 LAB — HIV ANTIBODY (ROUTINE TESTING W REFLEX): HIV 1&2 Ab, 4th Generation: NONREACTIVE

## 2017-11-15 ENCOUNTER — Telehealth: Payer: Self-pay | Admitting: Family Medicine

## 2017-11-15 MED ORDER — OSELTAMIVIR PHOSPHATE 75 MG PO CAPS: 75.0000 mg | ORAL_CAPSULE | Freq: Every day | ORAL | 0 refills | Status: DC

## 2017-11-15 NOTE — Telephone Encounter (Signed)
Ok to send in tamiflu?

## 2017-11-15 NOTE — Telephone Encounter (Signed)
Ok for Tamiflu 75mg 1 tab daily x10 days 

## 2017-11-15 NOTE — Telephone Encounter (Signed)
Patient notified of PCP recommendations and is agreement and expresses an understanding. Med filled to [place] for [facility] to Discuss results / PCP recommendations / Schedule patient.

## 2017-11-15 NOTE — Telephone Encounter (Signed)
Copied from [place] [id number]. Topic: Quick Communication - See Telephone Encounter >> Nov 15, 2017 10:23 AM Bea Graff, NT wrote: CRM for notification. See Telephone encounter for: Pt states that her daughter has been diagnosed with the flu and she would like to see if the preventative tamiflu can be called into her pharmacy. Uses [facility] on [address].   11/15/17.

## 2018-02-22 LAB — HM PAP SMEAR

## 2018-02-22 LAB — HM MAMMOGRAPHY: HM MAMMO: NORMAL (ref 0–4)

## 2018-05-25 ENCOUNTER — Other Ambulatory Visit: Payer: Self-pay

## 2018-05-25 ENCOUNTER — Ambulatory Visit: Payer: Managed Care, Other (non HMO) | Admitting: Family Medicine

## 2018-05-25 ENCOUNTER — Ambulatory Visit (INDEPENDENT_AMBULATORY_CARE_PROVIDER_SITE_OTHER)
Admission: RE | Admit: 2018-05-25 | Discharge: 2018-05-25 | Disposition: A | Discharge status: Home / Self Care | Payer: Managed Care, Other (non HMO) | Source: Ambulatory Visit | Attending: Family Medicine | Admitting: Family Medicine

## 2018-05-25 ENCOUNTER — Encounter: Payer: Self-pay | Admitting: Family Medicine

## 2018-05-25 ENCOUNTER — Other Ambulatory Visit: Payer: Self-pay | Admitting: Family Medicine

## 2018-05-25 VITALS — BP 105/70 | HR 74 | Temp 98.0°F | Resp 16 | Ht 69.0 in | Wt 156.2 lb

## 2018-05-25 DIAGNOSIS — F419 Anxiety disorder, unspecified: Secondary | ICD-10-CM

## 2018-05-25 DIAGNOSIS — M545 Low back pain, unspecified: Secondary | ICD-10-CM

## 2018-05-25 DIAGNOSIS — M546 Pain in thoracic spine: Secondary | ICD-10-CM

## 2018-05-25 DIAGNOSIS — F329 Major depressive disorder, single episode, unspecified: Secondary | ICD-10-CM | POA: Diagnosis not present

## 2018-05-25 DIAGNOSIS — K219 Gastro-esophageal reflux disease without esophagitis: Secondary | ICD-10-CM | POA: Insufficient documentation

## 2018-05-25 MED ORDER — RANITIDINE HCL 150 MG PO TABS: 150.0000 mg | ORAL_TABLET | Freq: Two times a day (BID) | ORAL | 3 refills | Status: DC

## 2018-05-25 MED ORDER — MELOXICAM 15 MG PO TABS: 15.0000 mg | ORAL_TABLET | Freq: Every day | ORAL | 0 refills | Status: DC

## 2018-05-25 MED ORDER — SERTRALINE HCL 25 MG PO TABS: 25.0000 mg | ORAL_TABLET | Freq: Every day | ORAL | 3 refills | Status: DC

## 2018-05-25 NOTE — Progress Notes (Signed)
   Subjective:    Patient ID: Catherine Camacho, female    DOB: 1976/01/06, 42 y.o.   MRN: [id number]  HPI 'my back has just been killing me'- sxs have worsened in the last 2 weeks.  'it hurts in different places throughout the day'.  Primarily mid to low back but 'when it really hurts, it hurts my whole midsection'.  No known injury.  Back at work x1 month (since school started).  Pain is described as a 'throbbing'.  Pain is worse in the afternoon after being on feet and bending over at school.  Some relief w/ ibuprofen and heat.   Anxiety- pt is waking nightly at 2am due to racing thoughts.  She is not on anything for anxiety at this time.  Melatonin is not working at this time.  GERD- worsening since the school year started as pt has to eat much more quickly.  Wants to know what to take.    Review of Systems For ROS see HPI     Objective:   Physical Exam  Constitutional: She is oriented to person, place, and time. She appears well-developed and well-nourished. No distress.  HENT:  Head: Normocephalic and atraumatic.  Musculoskeletal: She exhibits tenderness (mild TTP over lumbar paraspinals bilaterally).  Exaggerated focal (2 vertebral body) lordosis w/ bony protuberance on either side, R>L and mild TTP Full back flexion/extension  Neurological: She is alert and oriented to person, place, and time. She displays normal reflexes. No cranial nerve deficit. She exhibits normal muscle tone. Coordination normal.  (-) SLR  Skin: Skin is warm and dry. No rash noted. No erythema.  Psychiatric: She has a normal mood and affect. Her behavior is normal. Thought content normal.  Vitals reviewed.         Assessment & Plan:  Thoracolumbar back pain- new to provider, ongoing for pt.  She has always felt this was due to poor posture but recently pain has worsened.  Will get xrays due to focal exaggerated lordosis w/ bony protuberances on either side.  May need MRI in the future.  Start daily  Meloxicam for pain- discussed possible impact on GERD.  Reviewed supportive care and red flags that should prompt return.  Pt expressed understanding and is in agreement w/ plan.

## 2018-05-25 NOTE — Patient Instructions (Signed)
Follow up in 1 month to recheck anxiety, reflux, and back pain Go to [place] to get your xrays done (across from [facility]) START the Meloxicam once daily- take w/ food START the Ranitidine nightly (can increase to twice daily) for the reflux START the Sertraline nightly to help w/ anxiety and sleep (this is a VERY low dose) Call with any questions or concerns Hang in there!

## 2018-05-26 NOTE — Assessment & Plan Note (Signed)
New to provider, recurrent for pt.  Suspect this is due to her rapidity of eating- she agrees.  Will start H2 blocker and discussed lifestyle and dietary modifications.  Will follow.

## 2018-05-26 NOTE — Assessment & Plan Note (Signed)
Recurrent problem for pt.  This is now causing her to wake at night.  She was previously on medication and is open to revisiting this.  Will start low dose Sertraline nightly and monitor closely for improvement.  Pt expressed understanding and is in agreement w/ plan.

## 2018-05-27 ENCOUNTER — Encounter: Payer: Self-pay | Admitting: Family Medicine

## 2018-05-27 ENCOUNTER — Other Ambulatory Visit: Payer: Self-pay | Admitting: Family Medicine

## 2018-05-27 DIAGNOSIS — M546 Pain in thoracic spine: Secondary | ICD-10-CM

## 2018-05-27 DIAGNOSIS — M419 Scoliosis, unspecified: Secondary | ICD-10-CM

## 2018-05-27 DIAGNOSIS — M545 Low back pain: Secondary | ICD-10-CM

## 2018-06-09 ENCOUNTER — Encounter: Payer: Self-pay | Admitting: Family Medicine

## 2018-09-19 ENCOUNTER — Encounter: Payer: Self-pay | Admitting: Family Medicine

## 2018-09-19 ENCOUNTER — Ambulatory Visit (INDEPENDENT_AMBULATORY_CARE_PROVIDER_SITE_OTHER): Payer: Managed Care, Other (non HMO) | Admitting: Family Medicine

## 2018-09-19 ENCOUNTER — Encounter: Payer: Self-pay | Admitting: General Practice

## 2018-09-19 ENCOUNTER — Other Ambulatory Visit: Payer: Self-pay

## 2018-09-19 VITALS — BP 118/78 | HR 74 | Temp 98.1°F | Resp 16 | Ht 69.0 in | Wt 157.1 lb

## 2018-09-19 DIAGNOSIS — E785 Hyperlipidemia, unspecified: Secondary | ICD-10-CM | POA: Diagnosis not present

## 2018-09-19 DIAGNOSIS — Z0000 Encounter for general adult medical examination without abnormal findings: Secondary | ICD-10-CM | POA: Diagnosis not present

## 2018-09-19 DIAGNOSIS — E559 Vitamin D deficiency, unspecified: Secondary | ICD-10-CM

## 2018-09-19 LAB — CBC WITH DIFFERENTIAL/PLATELET
Basophils Absolute: 0 10*3/uL (ref 0.0–0.1)
Basophils Relative: 0.8 % (ref 0.0–3.0)
Eosinophils Absolute: 0.1 10*3/uL (ref 0.0–0.7)
Eosinophils Relative: 1.6 % (ref 0.0–5.0)
HCT: 38.3 % (ref 36.0–46.0)
Hemoglobin: 13 g/dL (ref 12.0–15.0)
Lymphocytes Relative: 44.4 % (ref 12.0–46.0)
Lymphs Abs: 2.4 10*3/uL (ref 0.7–4.0)
MCHC: 34 g/dL (ref 30.0–36.0)
MCV: 93.2 fl (ref 78.0–100.0)
Monocytes Absolute: 0.6 10*3/uL (ref 0.1–1.0)
Monocytes Relative: 10.1 % (ref 3.0–12.0)
Neutro Abs: 2.3 10*3/uL (ref 1.4–7.7)
Neutrophils Relative %: 43.1 % (ref 43.0–77.0)
Platelets: 242 10*3/uL (ref 150.0–400.0)
RBC: 4.11 Mil/uL (ref 3.87–5.11)
RDW: 13.5 % (ref 11.5–15.5)
WBC: 5.4 10*3/uL (ref 4.0–10.5)

## 2018-09-19 LAB — BASIC METABOLIC PANEL
BUN: 14 mg/dL (ref 6–23)
CALCIUM: 8.8 mg/dL (ref 8.4–10.5)
CO2: 25 mEq/L (ref 19–32)
Chloride: 104 mEq/L (ref 96–112)
Creatinine, Ser: 0.8 mg/dL (ref 0.40–1.20)
GFR: 82.6 mL/min (ref >60.00)
GLUCOSE: 97 mg/dL (ref 70–99)
POTASSIUM: 3.8 meq/L (ref 3.5–5.1)
SODIUM: 138 meq/L (ref 135–145)

## 2018-09-19 LAB — LIPID PANEL
CHOL/HDL RATIO: 3
Cholesterol: 181 mg/dL (ref 0–200)
HDL: 54.4 mg/dL (ref >39.00)
LDL CALC: 110 mg/dL — AB (ref 0–99)
NonHDL: 126.41
Triglycerides: 84 mg/dL (ref 0.0–149.0)
VLDL: 16.8 mg/dL (ref 0.0–40.0)

## 2018-09-19 LAB — HEPATIC FUNCTION PANEL
ALT: 12 U/L (ref 0–35)
AST: 13 U/L (ref 0–37)
Albumin: 4 g/dL (ref 3.5–5.2)
Alkaline Phosphatase: 39 U/L (ref 39–117)
Bilirubin, Direct: 0.1 mg/dL (ref 0.0–0.3)
Total Bilirubin: 0.5 mg/dL (ref 0.2–1.2)
Total Protein: 6.5 g/dL (ref 6.0–8.3)

## 2018-09-19 LAB — TSH: TSH: 1.64 u[IU]/mL (ref 0.35–4.50)

## 2018-09-19 LAB — VITAMIN D 25 HYDROXY (VIT D DEFICIENCY, FRACTURES): VITD: 31.78 ng/mL (ref 30.00–100.00)

## 2018-09-19 NOTE — Progress Notes (Signed)
   Subjective:    Patient ID: Catherine Camacho, female    DOB: 02-25-1976, 43 y.o.   MRN: [id number]  HPI CPE- UTD on pap, mammo, immunizations.  No concerns.  'i'm feeling good'.   Review of Systems Patient reports no vision/ hearing changes, adenopathy,fever, weight change,  persistant/recurrent hoarseness , swallowing issues, chest pain, palpitations, edema, persistant/recurrent cough, hemoptysis, dyspnea (rest/exertional/paroxysmal nocturnal), gastrointestinal bleeding (melena, rectal bleeding), abdominal pain, significant heartburn, bowel changes, GU symptoms (dysuria, hematuria, incontinence), Gyn symptoms (abnormal  bleeding, pain),  syncope, focal weakness, memory loss, numbness & tingling, skin/hair/nail changes, abnormal bruising or bleeding, anxiety, or depression.     Objective:   Physical Exam General Appearance:    Alert, cooperative, no distress, appears stated age  Head:    Normocephalic, without obvious abnormality, atraumatic  Eyes:    PERRL, conjunctiva/corneas clear, EOM's intact, fundi    benign, both eyes  Ears:    Normal TM's and external ear canals, both ears  Nose:   Nares normal, septum midline, mucosa normal, no drainage    or sinus tenderness  Throat:   Lips, mucosa, and tongue normal; teeth and gums normal  Neck:   Supple, symmetrical, trachea midline, no adenopathy;    Thyroid: no enlargement/tenderness/nodules  Back:     Symmetric, no curvature, ROM normal, no CVA tenderness  Lungs:     Clear to auscultation bilaterally, respirations unlabored  Chest Wall:    No tenderness or deformity   Heart:    Regular rate and rhythm, S1 and S2 normal, no murmur, rub   or gallop  Breast Exam:    Deferred to GYN  Abdomen:     Soft, non-tender, bowel sounds active all four quadrants,    no masses, no organomegaly  Genitalia:    Deferred to GYN  Rectal:    Extremities:   Extremities normal, atraumatic, no cyanosis or edema  Pulses:   2+ and symmetric all extremities    Skin:   Skin color, texture, turgor normal, no rashes or lesions  Lymph nodes:   Cervical, supraclavicular, and axillary nodes normal  Neurologic:   CNII-XII intact, normal strength, sensation and reflexes    throughout          Assessment & Plan:

## 2018-09-19 NOTE — Assessment & Plan Note (Signed)
Pt's PE WNL.  UTD on GYN, immunizations.  Check labs.  Anticipatory guidance provided.  

## 2018-09-19 NOTE — Patient Instructions (Signed)
Follow up in 1 year or as needed We'll notify you of your lab results and make any changes if needed Keep up the good work!  You look great! Call with any questions or concerns Happy New Year!!

## 2018-09-19 NOTE — Assessment & Plan Note (Signed)
Check labs and replete prn. 

## 2018-10-04 ENCOUNTER — Other Ambulatory Visit: Payer: Self-pay

## 2018-10-04 ENCOUNTER — Ambulatory Visit: Payer: Self-pay

## 2018-10-04 ENCOUNTER — Encounter: Payer: Self-pay | Admitting: Physician Assistant

## 2018-10-04 ENCOUNTER — Ambulatory Visit: Payer: Managed Care, Other (non HMO) | Admitting: Physician Assistant

## 2018-10-04 VITALS — BP 100/60 | HR 74 | Temp 98.3°F | Resp 16 | Ht 69.0 in | Wt 156.0 lb

## 2018-10-04 DIAGNOSIS — J9801 Acute bronchospasm: Secondary | ICD-10-CM | POA: Diagnosis not present

## 2018-10-04 DIAGNOSIS — J101 Influenza due to other identified influenza virus with other respiratory manifestations: Secondary | ICD-10-CM | POA: Diagnosis not present

## 2018-10-04 MED ORDER — BENZONATATE 100 MG PO CAPS: 100.0000 mg | ORAL_CAPSULE | Freq: Two times a day (BID) | ORAL | 0 refills | Status: DC | PRN

## 2018-10-04 MED ORDER — ALBUTEROL SULFATE HFA 108 (90 BASE) MCG/ACT IN AERS: 2.0000 | INHALATION_SPRAY | Freq: Four times a day (QID) | RESPIRATORY_TRACT | 0 refills | Status: DC | PRN

## 2018-10-04 NOTE — Progress Notes (Signed)
Patient presents to clinic today c/o chest tightness and significant coughing spells over the past few days. Was seen at Urgent Care on Sunday and diagnosed with Influenza A. Was started on Tamiflu. Is taking as directed. Notes resolution of fever and aches. Still noting chest congestion and dry cough. Spells of coughing are worse and associated with chest tightness. Denies wheezing. Notes a burning sensation in lungs occasionally with deep breaths. .    Past Medical History:  Diagnosis Date  . ADHD (attention deficit hyperactivity disorder)   . Anxiety     Current Outpatient Medications on File Prior to Visit  Medication Sig Dispense Refill  . Cholecalciferol (VITAMIN D PO) Take 1 tablet by mouth daily.    . Omega-3 Fatty Acids (FISH OIL) 1000 MG CAPS Take 1 capsule by mouth daily.    Marland Kitchen oseltamivir (TAMIFLU) 75 MG capsule Take by mouth.    . vitamin E (VITAMIN E) 400 UNIT capsule Take 400 Units by mouth daily.     No current facility-administered medications on file prior to visit.     Allergies  Allergen Reactions  . Lexapro [Escitalopram Oxalate] Other (See Comments)    Made her feel numb    Family History  Problem Relation Age of Onset  . Healthy Mother   . Osteoporosis Mother   . Healthy Father   . Hypertension Brother   . Hyperlipidemia Brother   . Depression Maternal Grandmother   . Stroke Maternal Grandfather   . Osteoporosis Paternal Grandmother   . Heart disease Paternal Grandfather   . Other Paternal Grandfather        suicide  . Healthy Daughter   . Healthy Son     Social History   Socioeconomic History  . Marital status: Married    Spouse name: Not on file  . Number of children: 2  . Years of education: Not on file  . Highest education level: Not on file  Occupational History  . 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 Financial resource strain: Not on file  . Food insecurity:    Worry: Not on file   Inability: Not on file  . Transportation needs:    Medical: Not on file    Non-medical: Not on file  Tobacco Use  . Smoking status: Never Smoker  . Smokeless tobacco: Never Used  Substance and Sexual Activity  . Alcohol use: Yes  . Drug use: No  . Sexual activity: Yes    Birth control/protection: Surgical    Comment: hysterectomy  Lifestyle  . Physical activity:    Days per week: Not on file    Minutes per session: Not on file  . Stress: Not on file  Relationships  . Social connections:    Talks on phone: Not on file    Gets together: Not on file    Attends religious service: Not on file    Active member of club or organization: Not on file    Attends meetings of clubs or organizations: Not on file    Relationship status: Not on file  Other Topics Concern  . Not on file  Social History Narrative  . Not on file   Review of Systems - See HPI.  All other ROS are negative.  BP 100/60   Pulse 74   Temp 98.3 F (36.8 C) (Oral)   Resp 16   Ht 5\' 9"  (1.753 m)   Wt 156 lb (70.8  kg)   SpO2 99%   BMI 23.04 kg/m   Physical Exam Vitals signs reviewed.  Constitutional:      Appearance: Normal appearance.  HENT:     Head: Normocephalic and atraumatic.     Right Ear: Tympanic membrane and ear canal normal.     Left Ear: Tympanic membrane and ear canal normal.     Nose: Nose normal.     Mouth/Throat:     Mouth: Mucous membranes are moist.  Eyes:     Conjunctiva/sclera: Conjunctivae normal.  Neck:     Musculoskeletal: Neck supple.  Cardiovascular:     Rate and Rhythm: Normal rate and regular rhythm.     Pulses: Normal pulses.     Heart sounds: Normal heart sounds.  Pulmonary:     Effort: Pulmonary effort is normal.     Breath sounds: Normal breath sounds.  Neurological:     Mental Status: She is alert.  Psychiatric:        Mood and Affect: Mood normal.    Recent Results (from the past 2160 hour(s))  Lipid panel     Status: Abnormal   Collection Time: 09/19/18   8:24 AM  Result Value Ref Range   Cholesterol 181 0 - 200 mg/dL    Comment: ATP III Classification       Desirable:  < 200 mg/dL               Borderline High:  200 - 239 mg/dL          High:  > = 240 mg/dL   Triglycerides 84.0 0.0 - 149.0 mg/dL    Comment: Normal:  <150 mg/dLBorderline High:  150 - 199 mg/dL   HDL 54.40 >39.00 mg/dL   VLDL 16.8 0.0 - 40.0 mg/dL   LDL Cholesterol 110 (H) 0 - 99 mg/dL   Total CHOL/HDL Ratio 3     Comment:                Men          Women1/2 Average Risk     3.4          3.3Average Risk          5.0          4.42X Average Risk          9.6          7.13X Average Risk          15.0          11.0                       NonHDL 126.41     Comment: NOTE:  Non-HDL goal should be 30 mg/dL higher than patient's LDL goal (i.e. LDL goal of < 70 mg/dL, would have non-HDL goal of < 100 mg/dL)  Basic metabolic panel     Status: None   Collection Time: 09/19/18  8:24 AM  Result Value Ref Range   Sodium 138 135 - 145 mEq/L   Potassium 3.8 3.5 - 5.1 mEq/L   Chloride 104 96 - 112 mEq/L   CO2 25 19 - 32 mEq/L   Glucose, Bld 97 70 - 99 mg/dL   BUN 14 6 - 23 mg/dL   Creatinine, Ser 0.80 0.40 - 1.20 mg/dL   Calcium 8.8 8.4 - 10.5 mg/dL   GFR 82.60 >60.00 mL/min  TSH     Status: None   Collection Time: 09/19/18  8:24 AM  Result Value Ref Range   TSH 1.64 0.35 - 4.50 uIU/mL  Hepatic function panel     Status: None   Collection Time: 09/19/18  8:24 AM  Result Value Ref Range   Total Bilirubin 0.5 0.2 - 1.2 mg/dL   Bilirubin, Direct 0.1 0.0 - 0.3 mg/dL   Alkaline Phosphatase 39 39 - 117 U/L   AST 13 0 - 37 U/L   ALT 12 0 - 35 U/L   Total Protein 6.5 6.0 - 8.3 g/dL   Albumin 4.0 3.5 - 5.2 g/dL  CBC with Differential/Platelet     Status: None   Collection Time: 09/19/18  8:24 AM  Result Value Ref Range   WBC 5.4 4.0 - 10.5 K/uL   RBC 4.11 3.87 - 5.11 Mil/uL   Hemoglobin 13.0 12.0 - 15.0 g/dL   HCT 38.3 36.0 - 46.0 %   MCV 93.2 78.0 - 100.0 fl   MCHC 34.0 30.0 -  36.0 g/dL   RDW 13.5 11.5 - 15.5 %   Platelets 242.0 150.0 - 400.0 K/uL   Neutrophils Relative % 43.1 43.0 - 77.0 %   Lymphocytes Relative 44.4 12.0 - 46.0 %   Monocytes Relative 10.1 3.0 - 12.0 %   Eosinophils Relative 1.6 0.0 - 5.0 %   Basophils Relative 0.8 0.0 - 3.0 %   Neutro Abs 2.3 1.4 - 7.7 K/uL   Lymphs Abs 2.4 0.7 - 4.0 K/uL   Monocytes Absolute 0.6 0.1 - 1.0 K/uL   Eosinophils Absolute 0.1 0.0 - 0.7 K/uL   Basophils Absolute 0.0 0.0 - 0.1 K/uL  VITAMIN D 25 Hydroxy (Vit-D Deficiency, Fractures)     Status: None   Collection Time: 09/19/18  8:24 AM  Result Value Ref Range   VITD 31.78 30.00 - 100.00 ng/mL    Assessment/Plan: 1. Influenza A No fever for 2 days. Tolerating Tamiflu well. Symptoms improving but noting chest tightness and spells of significant coughing. Finish entire course of Tamiflu. Continue Nyquil. Start Tessalon.  - benzonatate (TESSALON) 100 MG capsule; Take 1 capsule (100 mg total) by mouth 2 (two) times daily as needed for cough.  Dispense: 20 capsule; Refill: 0 - albuterol (PROVENTIL HFA;VENTOLIN HFA) 108 (90 Base) MCG/ACT inhaler; Inhale 2 puffs into the lungs every 6 (six) hours as needed for wheezing or shortness of breath.  Dispense: 1 Inhaler; Refill: 0  2. Bronchospasm Treatment as above. Also Rx Albuterol MDI to use as directed if needed for chest tightness and bronchospasm. - benzonatate (TESSALON) 100 MG capsule; Take 1 capsule (100 mg total) by mouth 2 (two) times daily as needed for cough.  Dispense: 20 capsule; Refill: 0 - albuterol (PROVENTIL HFA;VENTOLIN HFA) 108 (90 Base) MCG/ACT inhaler; Inhale 2 puffs into the lungs every 6 (six) hours as needed for wheezing or shortness of breath.  Dispense: 1 Inhaler; Refill: 0  Return precautions reviewed with patient.    Leeanne Rio, PA-C

## 2018-10-04 NOTE — Telephone Encounter (Signed)
Out going call to patient.  Patient reports that she is on  Tamaflu.   And reports a  Burning  sensation  In lung area patient reports  That she  She has a dry cough.   Also reports tigtness lung area.    Wants to know if  This is normal.  Patient is home today request a return call.                                                                                                  Reason for Disposition . Caller has NON-URGENT medication question about med that PCP prescribed and triager unable to answer question  Answer Assessment - Initial Assessment Questions 1. SYMPTOMS: "Do you have any symptoms?"     Coughing  A lot.   Lungs sore.  A burning sensation.   2. SEVERITY: If symptoms are present, ask "Are they mild, moderate or severe?"     Mild to moderate.  Protocols used: MEDICATION QUESTION CALL-A-AH

## 2018-10-04 NOTE — Telephone Encounter (Signed)
Tamiflu does not cause burning in the lungs.  However, viral illness can.  It would be best for pt to have appt given her sxs and her concern.

## 2018-10-04 NOTE — Telephone Encounter (Signed)
Called and spoke with pt. She advised that she is having an intense cough. She was advised that the tamiflu would not cause lung tightness. However, the flu itself could.   While on the phone. Pt was talking normally, had to stop several times to cough. Once cough had resided pt seemed like she couldn't take a full breath. Sounded winded. Pt did not seem in distress but was advised that it might be best to have her come in for evaluation to ensure she was moving oxygen in her lungs well.   Pt was agreeable, in fact thanked me for getting her an appt. Pt scheduled with Elyn Aquas, PA-C at 1:30 today.  Note routed to him to inform.

## 2018-10-04 NOTE — Progress Notes (Deleted)
Acute Office Visit  Subjective:    Patient ID: Catherine Camacho, female    DOB: March 07, 1976, 43 y.o.   MRN: [id number]  Chief Complaint  Patient presents with  . Cough    HPI Patient is in today for ***  Pt diagnosed with flu on Sunday and given Tamiflu at Urgent care. Pt now feels that cough has created chest tightness and chest congestion. Cough is non-productive.  Pt endorses SOB with burning sensation with breathing.  Pt endorses that nasal congestion, fatigue, body aches are improving  Pt denies nausea, vomiting, diarrhea.     Past Medical History:  Diagnosis Date  . ADHD (attention deficit hyperactivity disorder)   . Anxiety     Past Surgical History:  Procedure Laterality Date  . ABDOMINAL HYSTERECTOMY     partial    Family History  Problem Relation Age of Onset  . Healthy Mother   . Osteoporosis Mother   . Healthy Father   . Hypertension Brother   . Hyperlipidemia Brother   . Depression Maternal Grandmother   . Stroke Maternal Grandfather   . Osteoporosis Paternal Grandmother   . Heart disease Paternal Grandfather   . Other Paternal Grandfather        suicide  . Healthy Daughter   . Healthy Son     Social History   Socioeconomic History  . Marital status: Married    Spouse name: Not on file  . Number of children: 2  . Years of education: Not on file  . Highest education level: Not on file  Occupational History  . 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 Financial resource strain: Not on file  . Food insecurity:    Worry: Not on file    Inability: Not on file  . Transportation needs:    Medical: Not on file    Non-medical: Not on file  Tobacco Use  . Smoking status: Never Smoker  . Smokeless tobacco: Never Used  Substance and Sexual Activity  . Alcohol use: Yes  . Drug use: No  . Sexual activity: Yes    Birth control/protection: Surgical    Comment: hysterectomy  Lifestyle  . Physical  activity:    Days per week: Not on file    Minutes per session: Not on file  . Stress: Not on file  Relationships  . Social connections:    Talks on phone: Not on file    Gets together: Not on file    Attends religious service: Not on file    Active member of club or organization: Not on file    Attends meetings of clubs or organizations: Not on file    Relationship status: Not on file  . Intimate partner violence:    Fear of current or ex partner: Not on file    Emotionally abused: Not on file    Physically abused: Not on file    Forced sexual activity: Not on file  Other Topics Concern  . Not on file  Social History Narrative  . Not on file    Outpatient Medications Prior to Visit  Medication Sig Dispense Refill  . Cholecalciferol (VITAMIN D PO) Take 1 tablet by mouth daily.    . Omega-3 Fatty Acids (FISH OIL) 1000 MG CAPS Take 1 capsule by mouth daily.    Marland Kitchen oseltamivir (TAMIFLU) 75 MG capsule Take by mouth.    . vitamin E (VITAMIN E) 400 UNIT capsule Take  400 Units by mouth daily.     No facility-administered medications prior to visit.     Allergies  Allergen Reactions  . Lexapro [Escitalopram Oxalate] Other (See Comments)    Made her feel numb    ROS     Objective:    Physical Exam  HENT:  Right Ear: Hearing, tympanic membrane and ear canal normal.  Left Ear: Hearing, tympanic membrane and ear canal normal.  Mouth/Throat: Uvula is midline, oropharynx is clear and moist and mucous membranes are normal.  Cardiovascular: Normal rate, regular rhythm and normal heart sounds.  Pulmonary/Chest: Effort normal and breath sounds normal.    BP 100/60   Pulse 74   Temp 98.3 F (36.8 C) (Oral)   Resp 16   Ht 5\' 9"  (1.753 m)   Wt 70.8 kg   SpO2 99%   BMI 23.04 kg/m  Wt Readings from Last 3 Encounters:  10/04/18 70.8 kg  09/19/18 71.3 kg  05/25/18 70.9 kg    There are no preventive care reminders to display for this patient.  There are no preventive care  reminders to display for this patient.   Lab Results  Component Value Date   TSH 1.64 09/19/2018   Lab Results  Component Value Date   WBC 5.4 09/19/2018   HGB 13.0 09/19/2018   HCT 38.3 09/19/2018   MCV 93.2 09/19/2018   PLT 242.0 09/19/2018   Lab Results  Component Value Date   NA 138 09/19/2018   K 3.8 09/19/2018   CO2 25 09/19/2018   GLUCOSE 97 09/19/2018   BUN 14 09/19/2018   CREATININE 0.80 09/19/2018   BILITOT 0.5 09/19/2018   ALKPHOS 39 09/19/2018   AST 13 09/19/2018   ALT 12 09/19/2018   PROT 6.5 09/19/2018   ALBUMIN 4.0 09/19/2018   CALCIUM 8.8 09/19/2018   GFR 82.60 09/19/2018   Lab Results  Component Value Date   CHOL 181 09/19/2018   Lab Results  Component Value Date   HDL 54.40 09/19/2018   Lab Results  Component Value Date   LDLCALC 110 (H) 09/19/2018   Lab Results  Component Value Date   TRIG 84.0 09/19/2018   Lab Results  Component Value Date   CHOLHDL 3 09/19/2018   No results found for: HGBA1C     Assessment & Plan:   Problem List Items Addressed This Visit    None       No orders of the defined types were placed in this encounter.    Dalten Ambrosino E Tajae Maiolo, Student-PA

## 2018-10-04 NOTE — Patient Instructions (Signed)
Please finish entire course of the Tamiflu given by Urgent Care.  Start the [facility] for cough. Increase fluids and get plenty of rest. Ok to continue Nyquil. Use the inhaler as directed if needed for chest tightness.   How to Use a Metered Dose Inhaler A metered dose inhaler is a handheld device for taking medicine that must be breathed into the lungs (inhaled). The device can be used to deliver a variety of inhaled medicines, including:  Quick relief or rescue medicines, such as bronchodilators.  Controller medicines, such as corticosteroids. The medicine is delivered by pushing down on a metal canister to release a preset amount of spray and medicine. Each device contains the amount of medicine that is needed for a preset number of uses (inhalations). Your health care provider may recommend that you use a spacer with your inhaler to help you take the medicine more effectively. A spacer is a plastic tube with a mouthpiece on one end and an opening that connects to the inhaler on the other end. A spacer holds the medicine in a tube for a short time, which allows you to inhale more medicine. What are the risks? If you do not use your inhaler correctly, medicine might not reach your lungs to help you breathe. Inhaler medicine can cause side effects, such as:  Mouth or throat infection.  Cough.  Hoarseness.  Headache.  Nausea and vomiting.  Lung infection (pneumonia) in people who have a lung condition called COPD. How to use a metered dose inhaler without a spacer  1. Remove the cap from the inhaler. 2. If you are using the inhaler for the first time, shake it for 5 seconds, turn it away from your face, then release 4 puffs into the air. This is called priming. 3. Shake the inhaler for 5 seconds. 4. Position the inhaler so the top of the canister faces up. 5. Put your index finger on the top of the medicine canister. Support the bottom of the inhaler with your thumb. 6. Breathe  out normally and as completely as possible, away from the inhaler. 7. Either place the inhaler between your teeth and close your lips tightly around the mouthpiece, or hold the inhaler 1-2 inches (2.5-5 cm) away from your open mouth. Keep your tongue down out of the way. If you are unsure which technique to use, ask your health care provider. 8. Press the canister down with your index finger to release the medicine, then inhale deeply and slowly through your mouth (not your nose) until your lungs are completely filled. Inhaling should take 4-6 seconds. 9. Hold the medicine in your lungs for 5-10 seconds (10 seconds is best). This helps the medicine get into the small airways of your lungs. 10. With your lips in a tight circle (pursed), breathe out slowly. 11. Repeat steps 3-10 until you have taken the number of puffs that your health care provider directed. Wait about 1 minute between puffs or as directed. 12. Put the cap on the inhaler. 13. If you are using a steroid inhaler, rinse your mouth with water, gargle, and spit out the water. Do not swallow the water. How to use a metered dose inhaler with a spacer  1. Remove the cap from the inhaler. 2. If you are using the inhaler for the first time, shake it for 5 seconds, turn it away from your face, then release 4 puffs into the air. This is called priming. 3. Shake the inhaler for 5 seconds. 4. Place  the open end of the spacer onto the inhaler mouthpiece. 5. Position the inhaler so the top of the canister faces up and the spacer mouthpiece faces you. 6. Put your index finger on the top of the medicine canister. Support the bottom of the inhaler and the spacer with your thumb. 7. Breathe out normally and as completely as possible, away from the spacer. 8. Place the spacer between your teeth and close your lips tightly around it. Keep your tongue down out of the way. 9. Press the canister down with your index finger to release the medicine, then  inhale deeply and slowly through your mouth (not your nose) until your lungs are completely filled. Inhaling should take 4-6 seconds. 10. Hold the medicine in your lungs for 5-10 seconds (10 seconds is best). This helps the medicine get into the small airways of your lungs. 11. With your lips in a tight circle (pursed), breathe out slowly. 12. Repeat steps 3-11 until you have taken the number of puffs that your health care provider directed. Wait about 1 minute between puffs or as directed. 13. Remove the spacer from the inhaler and put the cap on the inhaler. 14. If you are using a steroid inhaler, rinse your mouth with water, gargle, and spit out the water. Do not swallow the water. Follow these instructions at home:  Take your inhaled medicine only as told by your health care provider. Do not use the inhaler more than directed by your health care provider.  Keep all follow-up visits as told by your health care provider. This is important.  If your inhaler has a counter, you can check it to determine how full your inhaler is. If your inhaler does not have a counter, ask your health care provider when you will need to refill your inhaler and write the refill date on a calendar or on your inhaler canister. Note that you cannot know when an inhaler is empty by shaking it.  Follow directions on the package insert for care and cleaning of your inhaler and spacer. Contact a health care provider if:  Symptoms are only partially relieved with your inhaler.  You are having trouble using your inhaler.  You have an increase in phlegm.  You have headaches. Get help right away if:  You feel little or no relief after using your inhaler.  You have dizziness.  You have a fast heart rate.  You have chills or a fever.  You have night sweats.  There is blood in your phlegm. Summary  A metered dose inhaler is a handheld device for taking medicine that must be breathed into the lungs  (inhaled).  The medicine is delivered by pushing down on a metal canister to release a preset amount of spray and medicine.  Each device contains the amount of medicine that is needed for a preset number of uses (inhalations). This information is not intended to replace advice given to you by your health care provider. Make sure you discuss any questions you have with your health care provider. Document Released: 08/24/2005 Document Revised: 03/15/2017 Document Reviewed: 07/14/2016 Elsevier Interactive Patient Education  2019 [employer].

## 2018-11-16 ENCOUNTER — Encounter: Payer: Self-pay | Admitting: Family Medicine

## 2019-03-22 LAB — HM MAMMOGRAPHY: HM Mammogram: NORMAL (ref 0–4)

## 2019-03-22 LAB — HM PAP SMEAR

## 2019-08-27 ENCOUNTER — Encounter: Payer: Self-pay | Admitting: Family Medicine

## 2019-08-28 ENCOUNTER — Other Ambulatory Visit: Payer: Self-pay

## 2019-08-28 ENCOUNTER — Encounter: Payer: Self-pay | Admitting: Physician Assistant

## 2019-08-28 ENCOUNTER — Ambulatory Visit: Payer: Managed Care, Other (non HMO) | Admitting: Physician Assistant

## 2019-08-28 VITALS — BP 100/60 | HR 105 | Temp 98.6°F | Resp 16 | Ht 69.0 in | Wt 156.0 lb

## 2019-08-28 DIAGNOSIS — R300 Dysuria: Secondary | ICD-10-CM

## 2019-08-28 LAB — POCT URINALYSIS DIPSTICK
Bilirubin, UA: NEGATIVE
Blood, UA: NEGATIVE
Glucose, UA: NEGATIVE
Leukocytes, UA: NEGATIVE
Nitrite, UA: NEGATIVE
Protein, UA: NEGATIVE
Spec Grav, UA: >=1.03 — AB (ref 1.010–1.025)
Urobilinogen, UA: 0.2 E.U./dL
pH, UA: 5.5 (ref 5.0–8.0)

## 2019-08-28 MED ORDER — CEPHALEXIN 500 MG PO CAPS: 500.0000 mg | ORAL_CAPSULE | Freq: Two times a day (BID) | ORAL | 0 refills | Status: AC

## 2019-08-28 NOTE — Patient Instructions (Signed)
Your symptoms are consistent with a bladder infection, also called acute cystitis. Please take your antibiotic (Keflex) as directed until all pills are gone.  Stay very well hydrated.  Consider a daily probiotic (Align, Culturelle, or Activia) to help prevent stomach upset caused by the antibiotic.  Taking a probiotic daily may also help prevent recurrent UTIs.  Also consider taking AZO (Phenazopyridine) tablets to help decrease pain with urination.  I will call you with your urine testing results.  We will change antibiotics if indicated.  Call or return to clinic if symptoms are not resolved by completion of antibiotic.   Urinary Tract Infection A urinary tract infection (UTI) can occur any place along the urinary tract. The tract includes the kidneys, ureters, bladder, and urethra. A type of germ called bacteria often causes a UTI. UTIs are often helped with antibiotic medicine.  HOME CARE   If given, take antibiotics as told by your doctor. Finish them even if you start to feel better.  Drink enough fluids to keep your pee (urine) clear or pale yellow.  Avoid tea, drinks with caffeine, and bubbly (carbonated) drinks.  Pee often. Avoid holding your pee in for a long time.  Pee before and after having sex (intercourse).  Wipe from front to back after you poop (bowel movement) if you are a woman. Use each tissue only once. GET HELP RIGHT AWAY IF:   You have back pain.  You have lower belly (abdominal) pain.  You have chills.  You feel sick to your stomach (nauseous).  You throw up (vomit).  Your burning or discomfort with peeing does not go away.  You have a fever.  Your symptoms are not better in 3 days. MAKE SURE YOU:   Understand these instructions.  Will watch your condition.  Will get help right away if you are not doing well or get worse. Document Released: 02/10/2008 Document Revised: 05/18/2012 Document Reviewed: 03/24/2012 Exit[employer] Patient Information 2015  [employer]. This information is not intended to replace advice given to you by your health care provider. Make sure you discuss any questions you have with your health care provider.   

## 2019-08-28 NOTE — Progress Notes (Signed)
Patient presents to clinic today c/o 1 day of suprapubic pressure and discomfort. Some urinary urgency, frequency but good bladder emptying. Denies dysuria during entire urination.  Some at start of stream. Denies hematuria. Denies nausea or vomiting. Denies decreased appetite. Denies vaginal pressure, pain or discharge. Has history of UTI but not in many years.  Has been taking Azo and drinking cranberry juice which has helped some with symptoms.  Past Medical History:  Diagnosis Date  . ADHD (attention deficit hyperactivity disorder)   . Anxiety     Current Outpatient Medications on File Prior to Visit  Medication Sig Dispense Refill  . Cholecalciferol (VITAMIN D PO) Take 1 tablet by mouth daily.    . Omega-3 Fatty Acids (FISH OIL) 1000 MG CAPS Take 1 capsule by mouth daily.    . vitamin E (VITAMIN E) 400 UNIT capsule Take 400 Units by mouth daily.    Marland Kitchen VYVANSE 30 MG capsule Take 30 mg by mouth every morning.     No current facility-administered medications on file prior to visit.    Allergies  Allergen Reactions  . Lexapro [Escitalopram Oxalate] Other (See Comments)    Made her feel numb    Family History  Problem Relation Age of Onset  . Healthy Mother   . Osteoporosis Mother   . Healthy Father   . Hypertension Brother   . Hyperlipidemia Brother   . Depression Maternal Grandmother   . Stroke Maternal Grandfather   . Osteoporosis Paternal Grandmother   . Heart disease Paternal Grandfather   . Other Paternal Grandfather        suicide  . Healthy Daughter   . Healthy Son     Social History   Socioeconomic History  . Marital status: Married    Spouse name: Not on file  . Number of children: 2  . Years of education: Not on file  . Highest education level: Not on file  Occupational History  . 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 Smoking status: Never Smoker  . Smokeless tobacco: Never Used  Substance and Sexual  Activity  . Alcohol use: Yes  . Drug use: No  . Sexual activity: Yes    Birth control/protection: Surgical    Comment: hysterectomy  Other Topics Concern  . Not on file  Social History Narrative  . Not on file   Social Determinants of Health   Financial Resource Strain:   . Difficulty of Paying Living Expenses: Not on file  Food Insecurity:   . Worried About Charity fundraiser in the Last Year: Not on file  . Ran Out of Food in the Last Year: Not on file  Transportation Needs:   . Lack of Transportation (Medical): Not on file  . Lack of Transportation (Non-Medical): Not on file  Physical Activity:   . Days of Exercise per Week: Not on file  . Minutes of Exercise per Session: Not on file  Stress:   . Feeling of Stress : Not on file  Social Connections:   . Frequency of Communication with Friends and Family: Not on file  . Frequency of Social Gatherings with Friends and Family: Not on file  . Attends Religious Services: Not on file  . Active Member of Clubs or Organizations: Not on file  . Attends Archivist Meetings: Not on file  . Marital Status: Not on file   Review of Systems - See HPI.  All  other ROS are negative.  BP 100/60   Pulse (!) 105   Temp 98.6 F (37 C) (Temporal)   Resp 16   Ht 5\' 9"  (1.753 m)   Wt 156 lb (70.8 kg)   SpO2 99%   BMI 23.04 kg/m   Physical Exam Vitals reviewed.  Constitutional:      Appearance: Normal appearance.  HENT:     Head: Normocephalic and atraumatic.  Cardiovascular:     Rate and Rhythm: Normal rate and regular rhythm.  Pulmonary:     Effort: Pulmonary effort is normal.  Abdominal:     General: There is no distension.     Palpations: Abdomen is soft. There is no mass.     Tenderness: Tenderness: suprapubic. There is no right CVA tenderness or left CVA tenderness.  Musculoskeletal:     Cervical back: Neck supple.  Neurological:     Mental Status: She is alert.  Psychiatric:        Mood and Affect: Mood  normal.    Recent Results (from the past 2160 hour(s))  POCT Urinalysis Dipstick     Status: Abnormal   Collection Time: 08/28/19  3:35 PM  Result Value Ref Range   Color, UA yellow    Clarity, UA clear    Glucose, UA Negative Negative   Bilirubin, UA negative    Ketones, UA trace    Spec Grav, UA >=1.030 (A) 1.010 - 1.025   Blood, UA negative    pH, UA 5.5 5.0 - 8.0   Protein, UA Negative Negative   Urobilinogen, UA 0.2 0.2 or 1.0 E.U./dL   Nitrite, UA negative    Leukocytes, UA Negative Negative   Appearance     Odor      Assessment/Plan: 1. Dysuria Classic symptoms of UTI, mild but short duration of symptoms thus far.  Point-of-care dipstick shows need to increase hydration.  Otherwise unremarkable.  Will send for culture.  Given classic symptoms will start empiric Keflex 500 mg twice daily x7 days.  Will alter treatment plan based on culture results and patient response to therapy.  Strict return precautions reviewed with patient.+ - POCT Urinalysis Dipstick - cephALEXin (KEFLEX) 500 MG capsule; Take 1 capsule (500 mg total) by mouth 2 (two) times daily for 7 days.  Dispense: 14 capsule; Refill: 0 - Urine Culture   Leeanne Rio, PA-C

## 2019-08-29 LAB — URINE CULTURE
MICRO NUMBER:: 1218361
Result:: NO GROWTH
SPECIMEN QUALITY:: ADEQUATE

## 2019-08-30 ENCOUNTER — Encounter: Payer: Self-pay | Admitting: Physician Assistant

## 2019-08-31 ENCOUNTER — Encounter: Payer: Self-pay | Admitting: Physician Assistant

## 2019-09-22 ENCOUNTER — Ambulatory Visit (INDEPENDENT_AMBULATORY_CARE_PROVIDER_SITE_OTHER): Payer: Managed Care, Other (non HMO) | Admitting: Family Medicine

## 2019-09-22 ENCOUNTER — Other Ambulatory Visit: Payer: Self-pay

## 2019-09-22 ENCOUNTER — Encounter: Payer: Self-pay | Admitting: Family Medicine

## 2019-09-22 VITALS — BP 102/69 | HR 70 | Temp 97.9°F | Resp 16 | Ht 69.0 in | Wt 155.4 lb

## 2019-09-22 DIAGNOSIS — Z0000 Encounter for general adult medical examination without abnormal findings: Secondary | ICD-10-CM | POA: Diagnosis not present

## 2019-09-22 DIAGNOSIS — M21612 Bunion of left foot: Secondary | ICD-10-CM

## 2019-09-22 DIAGNOSIS — M21611 Bunion of right foot: Secondary | ICD-10-CM

## 2019-09-22 DIAGNOSIS — E559 Vitamin D deficiency, unspecified: Secondary | ICD-10-CM

## 2019-09-22 LAB — LIPID PANEL
Cholesterol: 189 mg/dL (ref 0–200)
HDL: 61.3 mg/dL (ref >39.00)
LDL Cholesterol: 115 mg/dL — ABNORMAL HIGH (ref 0–99)
NonHDL: 128
Total CHOL/HDL Ratio: 3
Triglycerides: 66 mg/dL (ref 0.0–149.0)
VLDL: 13.2 mg/dL (ref 0.0–40.0)

## 2019-09-22 LAB — HEPATIC FUNCTION PANEL
ALT: 10 U/L (ref 0–35)
AST: 12 U/L (ref 0–37)
Albumin: 4.3 g/dL (ref 3.5–5.2)
Alkaline Phosphatase: 43 U/L (ref 39–117)
Bilirubin, Direct: 0.1 mg/dL (ref 0.0–0.3)
Total Bilirubin: 0.6 mg/dL (ref 0.2–1.2)
Total Protein: 6.7 g/dL (ref 6.0–8.3)

## 2019-09-22 LAB — BASIC METABOLIC PANEL
BUN: 14 mg/dL (ref 6–23)
CO2: 27 mEq/L (ref 19–32)
Calcium: 8.9 mg/dL (ref 8.4–10.5)
Chloride: 104 mEq/L (ref 96–112)
Creatinine, Ser: 0.77 mg/dL (ref 0.40–1.20)
GFR: 80.85 mL/min (ref >60.00)
Glucose, Bld: 91 mg/dL (ref 70–99)
Potassium: 4.4 mEq/L (ref 3.5–5.1)
Sodium: 138 mEq/L (ref 135–145)

## 2019-09-22 LAB — CBC WITH DIFFERENTIAL/PLATELET
Basophils Absolute: 0.1 10*3/uL (ref 0.0–0.1)
Basophils Relative: 0.8 % (ref 0.0–3.0)
Eosinophils Absolute: 0.1 10*3/uL (ref 0.0–0.7)
Eosinophils Relative: 1.3 % (ref 0.0–5.0)
HCT: 40.6 % (ref 36.0–46.0)
Hemoglobin: 13.6 g/dL (ref 12.0–15.0)
Lymphocytes Relative: 36.9 % (ref 12.0–46.0)
Lymphs Abs: 2.6 10*3/uL (ref 0.7–4.0)
MCHC: 33.5 g/dL (ref 30.0–36.0)
MCV: 92.9 fl (ref 78.0–100.0)
Monocytes Absolute: 0.7 10*3/uL (ref 0.1–1.0)
Monocytes Relative: 10.3 % (ref 3.0–12.0)
Neutro Abs: 3.5 10*3/uL (ref 1.4–7.7)
Neutrophils Relative %: 50.7 % (ref 43.0–77.0)
Platelets: 251 10*3/uL (ref 150.0–400.0)
RBC: 4.37 Mil/uL (ref 3.87–5.11)
RDW: 13.5 % (ref 11.5–15.5)
WBC: 7 10*3/uL (ref 4.0–10.5)

## 2019-09-22 LAB — TSH: TSH: 2.38 u[IU]/mL (ref 0.35–4.50)

## 2019-09-22 LAB — VITAMIN D 25 HYDROXY (VIT D DEFICIENCY, FRACTURES): VITD: 36.54 ng/mL (ref 30.00–100.00)

## 2019-09-22 NOTE — Assessment & Plan Note (Signed)
Pt w/ hx of this.  Check labs and replete prn.

## 2019-09-22 NOTE — Patient Instructions (Addendum)
Follow up in 1 year or as needed We'll notify you of your lab results and make any changes if needed Keep up the good work on healthy diet and regular exercise- you look great! We'll call you with your podiatry referral Call with any questions or concerns Stay Safe!  Stay Healthy!

## 2019-09-22 NOTE — Assessment & Plan Note (Signed)
Pt's PE WNL.  UTD on GYN and immunizations.  Check labs.  Anticipatory guidance provided.  

## 2019-09-22 NOTE — Progress Notes (Signed)
   Subjective:    Patient ID: Catherine Camacho, female    DOB: 1976/07/09, 44 y.o.   MRN: [id number]  HPI CPE- UTD on pap, mammo.  Exercising regularly.  Pt is teaching in person.   Review of Systems Patient reports no vision/ hearing changes, adenopathy,fever, weight change,  persistant/recurrent hoarseness , swallowing issues, chest pain, palpitations, edema, persistant/recurrent cough, hemoptysis, dyspnea (rest/exertional/paroxysmal nocturnal), gastrointestinal bleeding (melena, rectal bleeding), abdominal pain, significant heartburn, bowel changes, GU symptoms (dysuria, hematuria, incontinence), Gyn symptoms (abnormal  bleeding, pain),  syncope, focal weakness, memory loss, numbness & tingling, skin/hair/nail changes, abnormal bruising or bleeding, anxiety, or depression.   This visit occurred during the SARS-CoV-2 public health emergency.  Safety protocols were in place, including screening questions prior to the visit, additional usage of staff PPE, and extensive cleaning of exam room while observing appropriate contact time as indicated for disinfecting solutions.       Objective:   Physical Exam General Appearance:    Alert, cooperative, no distress, appears stated age  Head:    Normocephalic, without obvious abnormality, atraumatic  Eyes:    PERRL, conjunctiva/corneas clear, EOM's intact, fundi    benign, both eyes  Ears:    Normal TM's and external ear canals, both ears  Nose:   Deferred due to COVID  Throat:   Neck:   Supple, symmetrical, trachea midline, no adenopathy;    Thyroid: no enlargement/tenderness/nodules  Back:     Symmetric, no curvature, ROM normal, no CVA tenderness  Lungs:     Clear to auscultation bilaterally, respirations unlabored  Chest Wall:    No tenderness or deformity   Heart:    Regular rate and rhythm, S1 and S2 normal, no murmur, rub   or gallop  Breast Exam:    Deferred to GYN  Abdomen:     Soft, non-tender, bowel sounds active all four quadrants,      no masses, no organomegaly  Genitalia:    Deferred to GYN  Rectal:    Extremities:   Extremities normal, atraumatic, no cyanosis or edema  Pulses:   2+ and symmetric all extremities  Skin:   Skin color, texture, turgor normal, no rashes or lesions  Lymph nodes:   Cervical, supraclavicular, and axillary nodes normal  Neurologic:   CNII-XII intact, normal strength, sensation and reflexes    throughout          Assessment & Plan:

## 2019-11-04 ENCOUNTER — Ambulatory Visit: Payer: Managed Care, Other (non HMO) | Attending: Internal Medicine

## 2019-11-04 DIAGNOSIS — Z23 Encounter for immunization: Secondary | ICD-10-CM | POA: Insufficient documentation

## 2019-11-04 NOTE — Progress Notes (Signed)
   Covid-19 Vaccination Clinic  Name:  Catherine Camacho    MRN: [id number] DOB: [birth year]  11/04/2019  [name] was observed post Covid-19 immunization for 15 minutes without incidence. She was provided with Vaccine Information Sheet and instruction to access the V-Safe system.   [name] was instructed to call 911 with any severe reactions post vaccine: Marland Kitchen Difficulty breathing  . Swelling of your face and throat  . A fast heartbeat  . A bad rash all over your body  . Dizziness and weakness    Immunizations Administered    Name Date Dose VIS Date Route   Pfizer COVID-19 Vaccine 11/04/2019  1:33 PM 0.3 mL 08/18/2019 Intramuscular   Manufacturer: [place]   Lot: [id number]   [place]: [id number]

## 2019-11-13 ENCOUNTER — Ambulatory Visit: Payer: Managed Care, Other (non HMO) | Admitting: Podiatry

## 2019-11-25 ENCOUNTER — Ambulatory Visit: Payer: Managed Care, Other (non HMO) | Attending: Internal Medicine

## 2019-11-25 DIAGNOSIS — Z23 Encounter for immunization: Secondary | ICD-10-CM

## 2019-11-25 NOTE — Progress Notes (Signed)
   Covid-19 Vaccination Clinic  Name:  Catherine Camacho    MRN: [id number] DOB: October 23, 1975  11/25/2019  Catherine Camacho was observed post Covid-19 immunization for 15 minutes without incident. She was provided with Vaccine Information Sheet and instruction to access the V-Safe system.   Catherine Camacho was instructed to call 911 with any severe reactions post vaccine: Marland Kitchen Difficulty breathing  . Swelling of face and throat  . A fast heartbeat  . A bad rash all over body  . Dizziness and weakness   Immunizations Administered    Name Date Dose VIS Date Route   Pfizer COVID-19 Vaccine 11/25/2019  8:22 AM 0.3 mL 08/18/2019 Intramuscular   Manufacturer: [place]   Lot: [id number]   [place]: [id number]

## 2020-02-05 IMAGING — DX DG THORACIC SPINE 2V
3 series · 3 of 3 positions shown · non-contrast
Comparison: No recent prior.

CLINICAL DATA: Chronic pain.  No known injury.

EXAM:
THORACIC SPINE 2 VIEWS

[t-spine ap]
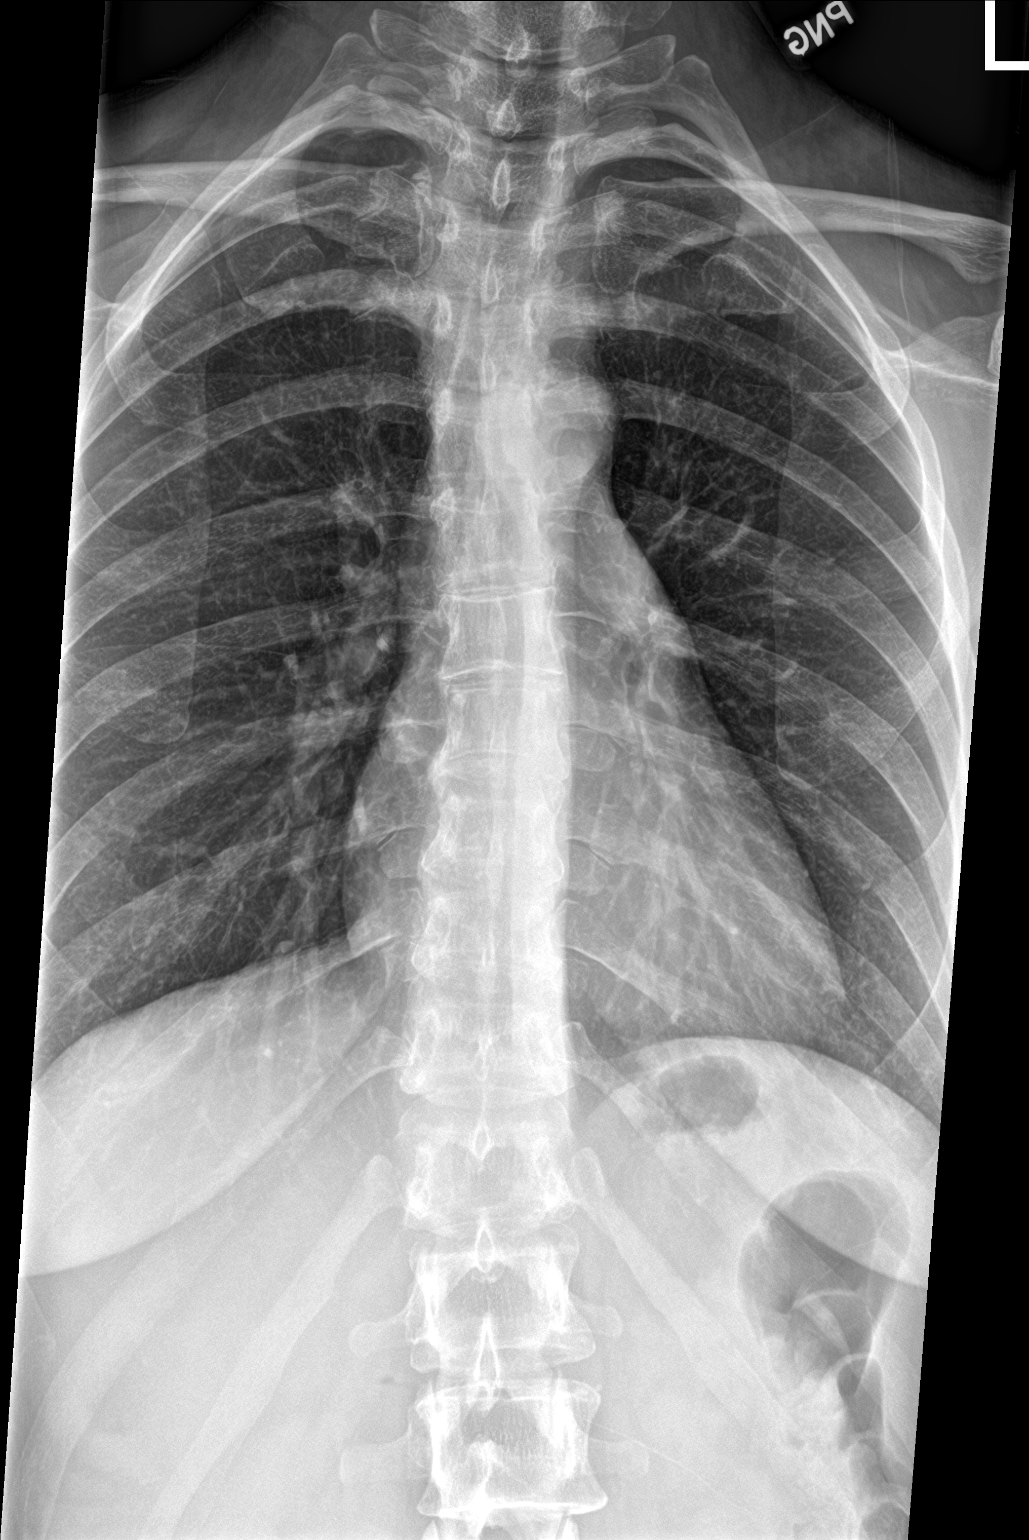

[t-spine lat]
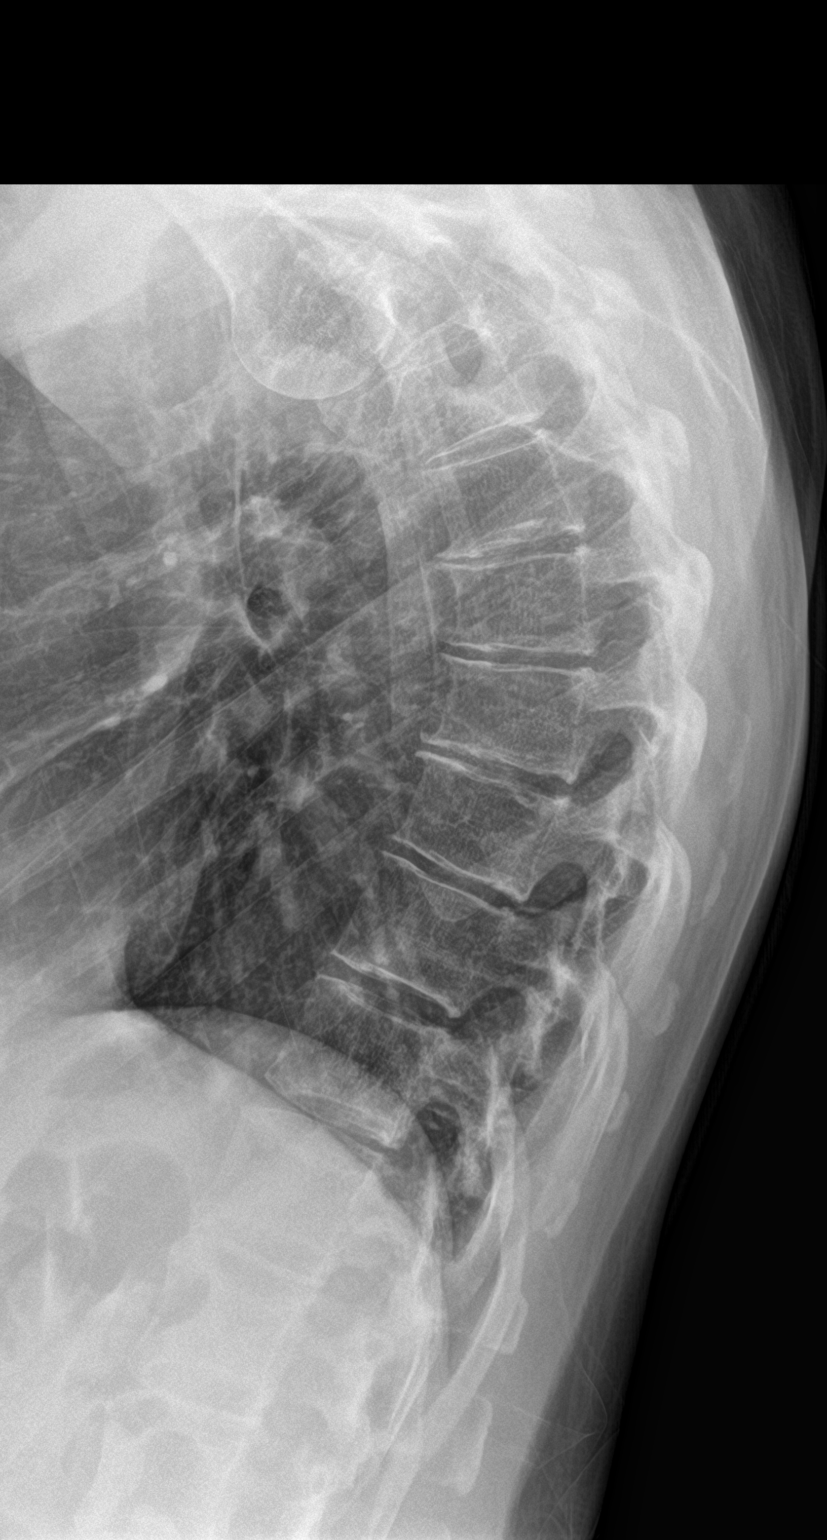

[t-spine flex]
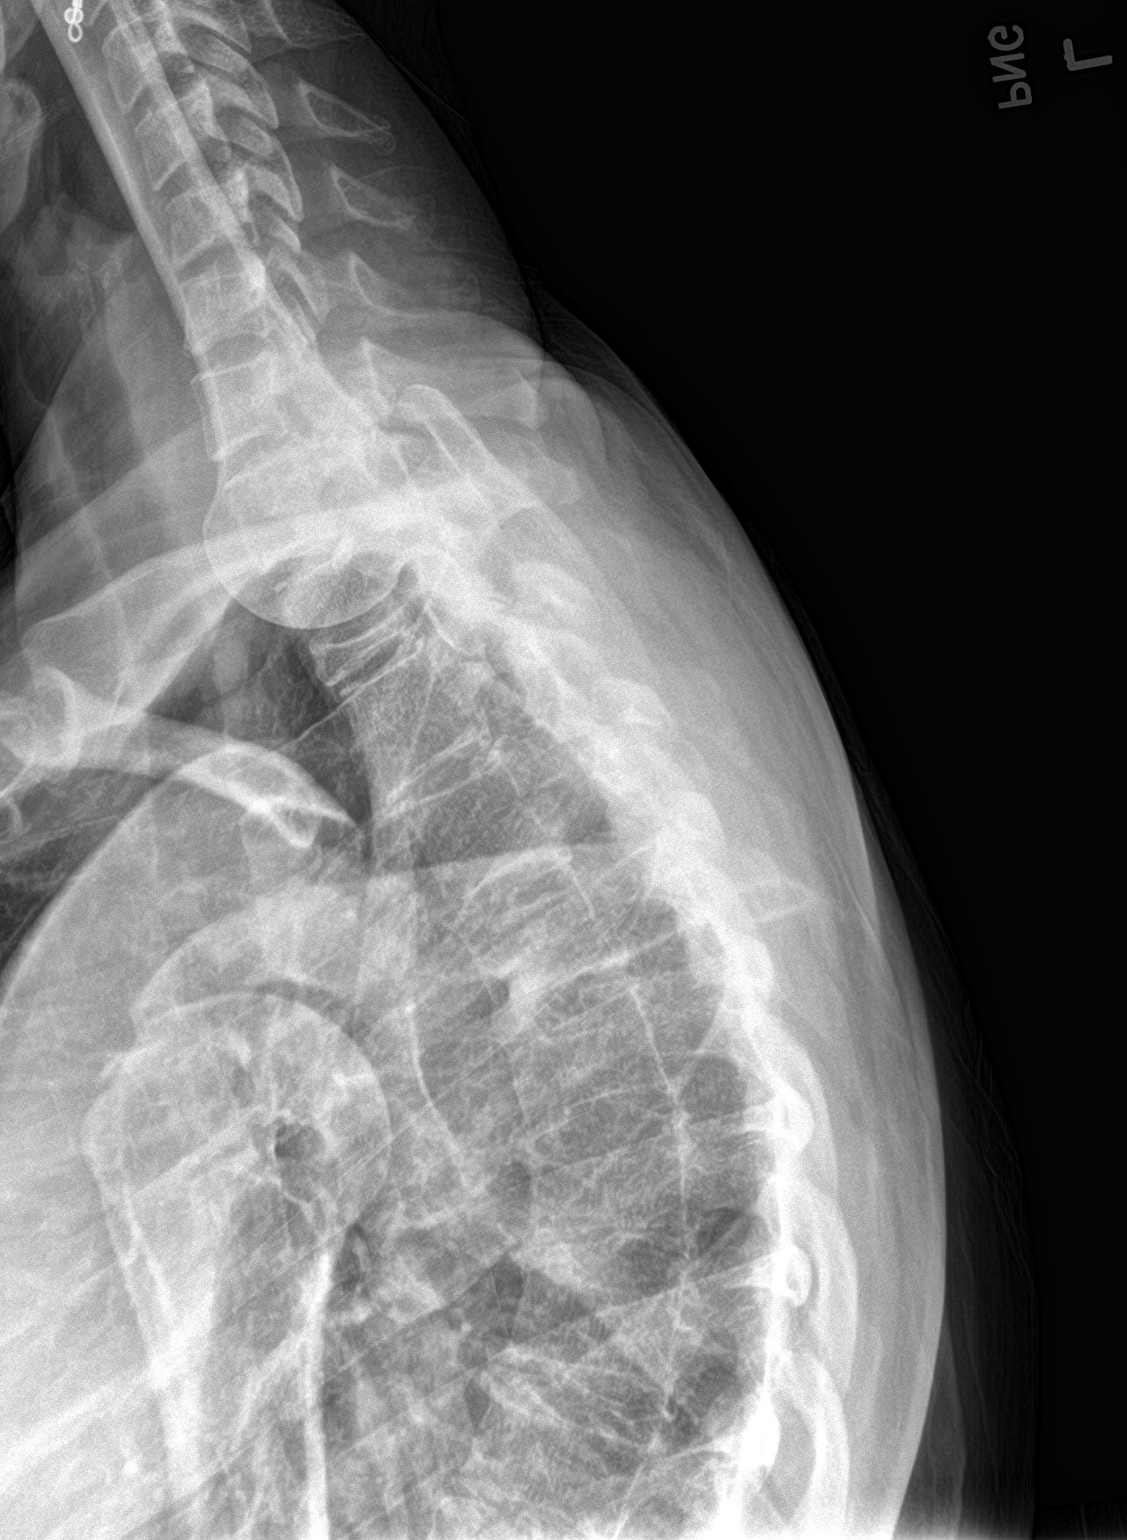

[3 of 3 positions shown; findings below may reference images not displayed]

FINDINGS: S shaped thoracic spine scoliosis noted. Diffuse multilevel mild
degenerative change. No acute bony abnormality.
IMPRESSION: Scoliosis and degenerative change thoracic spine. No acute
abnormality identified.

## 2020-04-15 ENCOUNTER — Encounter: Payer: Self-pay | Admitting: Family Medicine

## 2020-09-23 ENCOUNTER — Encounter: Payer: Managed Care, Other (non HMO) | Admitting: Family Medicine

## 2020-09-24 ENCOUNTER — Encounter: Payer: Managed Care, Other (non HMO) | Admitting: Family Medicine

## 2020-09-25 ENCOUNTER — Encounter: Payer: Managed Care, Other (non HMO) | Admitting: Physician Assistant

## 2020-09-27 ENCOUNTER — Encounter: Payer: Managed Care, Other (non HMO) | Admitting: Physician Assistant

## 2020-10-03 ENCOUNTER — Encounter: Payer: Self-pay | Admitting: Physician Assistant

## 2020-10-03 ENCOUNTER — Ambulatory Visit (INDEPENDENT_AMBULATORY_CARE_PROVIDER_SITE_OTHER): Payer: Managed Care, Other (non HMO) | Admitting: Physician Assistant

## 2020-10-03 ENCOUNTER — Other Ambulatory Visit: Payer: Self-pay

## 2020-10-03 VITALS — BP 112/80 | HR 72 | Temp 98.1°F | Wt 149.4 lb

## 2020-10-03 DIAGNOSIS — Z0000 Encounter for general adult medical examination without abnormal findings: Secondary | ICD-10-CM

## 2020-10-03 DIAGNOSIS — Z1211 Encounter for screening for malignant neoplasm of colon: Secondary | ICD-10-CM

## 2020-10-03 DIAGNOSIS — Z1159 Encounter for screening for other viral diseases: Secondary | ICD-10-CM

## 2020-10-03 LAB — CBC WITH DIFFERENTIAL/PLATELET
Basophils Absolute: 0 10*3/uL (ref 0.0–0.1)
Basophils Relative: 0.6 % (ref 0.0–3.0)
Eosinophils Absolute: 0.1 10*3/uL (ref 0.0–0.7)
Eosinophils Relative: 1.8 % (ref 0.0–5.0)
HCT: 40.1 % (ref 36.0–46.0)
Hemoglobin: 13.7 g/dL (ref 12.0–15.0)
Lymphocytes Relative: 50.2 % — ABNORMAL HIGH (ref 12.0–46.0)
Lymphs Abs: 2.1 10*3/uL (ref 0.7–4.0)
MCHC: 34.1 g/dL (ref 30.0–36.0)
MCV: 91.3 fl (ref 78.0–100.0)
Monocytes Absolute: 0.6 10*3/uL (ref 0.1–1.0)
Monocytes Relative: 13.2 % — ABNORMAL HIGH (ref 3.0–12.0)
Neutro Abs: 1.4 10*3/uL (ref 1.4–7.7)
Neutrophils Relative %: 34.2 % — ABNORMAL LOW (ref 43.0–77.0)
Platelets: 225 10*3/uL (ref 150.0–400.0)
RBC: 4.39 Mil/uL (ref 3.87–5.11)
RDW: 13.7 % (ref 11.5–15.5)
WBC: 4.2 10*3/uL (ref 4.0–10.5)

## 2020-10-03 LAB — COMPREHENSIVE METABOLIC PANEL
ALT: 9 U/L (ref 0–35)
AST: 12 U/L (ref 0–37)
Albumin: 4 g/dL (ref 3.5–5.2)
Alkaline Phosphatase: 43 U/L (ref 39–117)
BUN: 16 mg/dL (ref 6–23)
CO2: 30 mEq/L (ref 19–32)
Calcium: 9.1 mg/dL (ref 8.4–10.5)
Chloride: 106 mEq/L (ref 96–112)
Creatinine, Ser: 0.78 mg/dL (ref 0.40–1.20)
GFR: 90.95 mL/min (ref >60.00)
Glucose, Bld: 94 mg/dL (ref 70–99)
Potassium: 4.4 mEq/L (ref 3.5–5.1)
Sodium: 139 mEq/L (ref 135–145)
Total Bilirubin: 0.5 mg/dL (ref 0.2–1.2)
Total Protein: 6.6 g/dL (ref 6.0–8.3)

## 2020-10-03 LAB — LIPID PANEL
Cholesterol: 168 mg/dL (ref 0–200)
HDL: 41.7 mg/dL (ref >39.00)
LDL Cholesterol: 106 mg/dL — ABNORMAL HIGH (ref 0–99)
NonHDL: 126.78
Total CHOL/HDL Ratio: 4
Triglycerides: 102 mg/dL (ref 0.0–149.0)
VLDL: 20.4 mg/dL (ref 0.0–40.0)

## 2020-10-03 LAB — HEMOGLOBIN A1C: Hgb A1c MFr Bld: 5.6 % (ref 4.6–6.5)

## 2020-10-03 NOTE — Patient Instructions (Signed)
Please go to the lab for blood work.   Our office will call you with your results unless you have chosen to receive results via MyChart.  If your blood work is normal we will follow-up each year for physicals and as scheduled for chronic medical problems.  If anything is abnormal we will treat accordingly and get you in for a follow-up.  You will be contacted by Gastroenterology to schedule a screening colonoscopy.   Preventive Care 60-47 Years Old, Female Preventive care refers to lifestyle choices and visits with your health care provider that can promote health and wellness. This includes:  A yearly physical exam. This is also called an annual wellness visit.  Regular dental and eye exams.  Immunizations.  Screening for certain conditions.  Healthy lifestyle choices, such as: ? Eating a healthy diet. ? Getting regular exercise. ? Not using drugs or products that contain nicotine and tobacco. ? Limiting alcohol use. What can I expect for my preventive care visit? Physical exam Your health care provider will check your:  Height and weight. These may be used to calculate your BMI (body mass index). BMI is a measurement that tells if you are at a healthy weight.  Heart rate and blood pressure.  Body temperature.  Skin for abnormal spots. Counseling Your health care provider may ask you questions about your:  Past medical problems.  Family's medical history.  Alcohol, tobacco, and drug use.  Emotional well-being.  Home life and relationship well-being.  Sexual activity.  Diet, exercise, and sleep habits.  Work and work Statistician.  Access to firearms.  Method of birth control.  Menstrual cycle.  Pregnancy history. What immunizations do I need? Vaccines are usually given at various ages, according to a schedule. Your health care provider will recommend vaccines for you based on your age, medical history, and lifestyle or other factors, such as travel or  where you work.   What tests do I need? Blood tests  Lipid and cholesterol levels. These may be checked every 5 years, or more often if you are over 21 years old.  Hepatitis C test.  Hepatitis B test. Screening  Lung cancer screening. You may have this screening every year starting at age 44 if you have a 30-pack-year history of smoking and currently smoke or have quit within the past 15 years.  Colorectal cancer screening. ? All adults should have this screening starting at age 4 and continuing until age 63. ? Your health care provider may recommend screening at age 25 if you are at increased risk. ? You will have tests every 1-10 years, depending on your results and the type of screening test.  Diabetes screening. ? This is done by checking your blood sugar (glucose) after you have not eaten for a while (fasting). ? You may have this done every 1-3 years.  Mammogram. ? This may be done every 1-2 years. ? Talk with your health care provider about when you should start having regular mammograms. This may depend on whether you have a family history of breast cancer.  BRCA-related cancer screening. This may be done if you have a family history of breast, ovarian, tubal, or peritoneal cancers.  Pelvic exam and Pap test. ? This may be done every 3 years starting at age 47. ? Starting at age 57, this may be done every 5 years if you have a Pap test in combination with an HPV test. Other tests  STD (sexually transmitted disease) testing, if you are  at risk.  Bone density scan. This is done to screen for osteoporosis. You may have this scan if you are at high risk for osteoporosis. Talk with your health care provider about your test results, treatment options, and if necessary, the need for more tests. Follow these instructions at home: Eating and drinking  Eat a diet that includes fresh fruits and vegetables, whole grains, lean protein, and low-fat dairy products.  Take vitamin  and mineral supplements as recommended by your health care provider.  Do not drink alcohol if: ? Your health care provider tells you not to drink. ? You are pregnant, may be pregnant, or are planning to become pregnant.  If you drink alcohol: ? Limit how much you have to 0-1 drink a day. ? Be aware of how much alcohol is in your drink. In the [place] one drink equals one 12 oz bottle of beer (355 mL), one 5 oz glass of wine (148 mL), or one 1 oz glass of hard liquor (44 mL).   Lifestyle  Take daily care of your teeth and gums. Brush your teeth every morning and night with fluoride toothpaste. Floss one time each day.  Stay active. Exercise for at least 30 minutes 5 or more days each week.  Do not use any products that contain nicotine or tobacco, such as cigarettes, e-cigarettes, and chewing tobacco. If you need help quitting, ask your health care provider.  Do not use drugs.  If you are sexually active, practice safe sex. Use a condom or other form of protection to prevent STIs (sexually transmitted infections).  If you do not wish to become pregnant, use a form of birth control. If you plan to become pregnant, see your health care provider for a prepregnancy visit.  If told by your health care provider, take low-dose aspirin daily starting at age 88.  Find healthy ways to cope with stress, such as: ? Meditation, yoga, or listening to music. ? Journaling. ? Talking to a trusted person. ? Spending time with friends and family. Safety  Always wear your seat belt while driving or riding in a vehicle.  Do not drive: ? If you have been drinking alcohol. Do not ride with someone who has been drinking. ? When you are tired or distracted. ? While texting.  Wear a helmet and other protective equipment during sports activities.  If you have firearms in your house, make sure you follow all gun safety procedures. What's next?  Visit your health care provider once a year for an annual  wellness visit.  Ask your health care provider how often you should have your eyes and teeth checked.  Stay up to date on all vaccines. This information is not intended to replace advice given to you by your health care provider. Make sure you discuss any questions you have with your health care provider. Document Revised: 05/28/2020 Document Reviewed: 05/05/2018 Elsevier Patient Education  2021 [employer]. .

## 2020-10-03 NOTE — Progress Notes (Signed)
Patient presents to clinic today for annual exam.  Patient is fasting for labs. Is keeping active and eating a well-balanced diet. Is sleeping well overall.   Acute Concerns: Denies acute concerns at today's visit.   Health Maintenance: Immunizations -- up-to-date on most. Still due to get her COVID booster Colon Cancer Screening -- to be discussed. Due.  Mammogram -- UTD   Past Medical History:  Diagnosis Date  . ADHD (attention deficit hyperactivity disorder)   . Anxiety     Past Surgical History:  Procedure Laterality Date  . ABDOMINAL HYSTERECTOMY     partial    Current Outpatient Medications on File Prior to Visit  Medication Sig Dispense Refill  . amphetamine-dextroamphetamine (ADDERALL XR) 5 MG 24 hr capsule Take 5 mg by mouth every morning. (Patient not taking: Reported on 10/03/2020)    . methylphenidate 18 MG PO CR tablet Take 18 mg by mouth every morning. (Patient not taking: Reported on 10/03/2020)    . Multiple Vitamins-Minerals (CENTRUM ULTRA WOMENS PO) Take by mouth. (Patient not taking: Reported on 10/03/2020)    . VYVANSE 40 MG capsule Take 40 mg by mouth every morning. (Patient not taking: Reported on 10/03/2020)     No current facility-administered medications on file prior to visit.    Allergies  Allergen Reactions  . Lexapro [Escitalopram Oxalate] Other (See Comments)    Made her feel numb    Family History  Problem Relation Age of Onset  . Healthy Mother   . Osteoporosis Mother   . Healthy Father   . Hypertension Brother   . Hyperlipidemia Brother   . Depression Maternal Grandmother   . Stroke Maternal Grandfather   . Osteoporosis Paternal Grandmother   . Heart disease Paternal Grandfather   . Other Paternal Grandfather        suicide  . Healthy Daughter   . Healthy Son     Social History   Socioeconomic History  . Marital status: Married    Spouse name: Not on file  . Number of children: 2  . Years of education: Not on file  .  Highest education level: Not on file  Occupational History  . 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 Smoking status: Never Smoker  . Smokeless tobacco: Never Used  Vaping Use  . Vaping Use: Never used  Substance and Sexual Activity  . Alcohol use: Yes  . Drug use: No  . Sexual activity: Yes    Birth control/protection: Surgical    Comment: hysterectomy  Other Topics Concern  . Not on file  Social History Narrative  . Not on file   Social Determinants of Health   Financial Resource Strain: Not on file  Food Insecurity: Not on file  Transportation Needs: Not on file  Physical Activity: Not on file  Stress: Not on file  Social Connections: Not on file  Intimate Partner Violence: Not on file    Review of Systems  Constitutional: Negative for fever and weight loss.  HENT: Negative for ear discharge, ear pain, hearing loss and tinnitus.   Eyes: Negative for blurred vision, double vision, photophobia and pain.  Respiratory: Negative for cough and shortness of breath.   Cardiovascular: Negative for chest pain and palpitations.  Gastrointestinal: Negative for abdominal pain, blood in stool, constipation, diarrhea, heartburn, melena, nausea and vomiting.  Genitourinary: Negative for dysuria, flank pain, frequency, hematuria and urgency.  Musculoskeletal: Negative for falls.  Neurological:  Negative for dizziness, loss of consciousness and headaches.  Endo/Heme/Allergies: Negative for environmental allergies.  Psychiatric/Behavioral: Negative for depression, hallucinations, substance abuse and suicidal ideas. The patient is not nervous/anxious and does not have insomnia.    BP 112/80   Pulse 72   Temp 98.1 F (36.7 C) (Temporal)   Wt 149 lb 6.4 oz (67.8 kg)   SpO2 98%   BMI 22.06 kg/m   Physical Exam Vitals reviewed.  Constitutional:      Appearance: Normal appearance.  HENT:     Head: Normocephalic and atraumatic.     Right  Ear: Tympanic membrane, ear canal and external ear normal.     Left Ear: Tympanic membrane, ear canal and external ear normal.     Nose: Nose normal. No mucosal edema.     Mouth/Throat:     Mouth: Oropharynx is clear and moist and mucous membranes are normal.     Pharynx: Uvula midline. No oropharyngeal exudate or posterior oropharyngeal erythema.  Eyes:     Conjunctiva/sclera: Conjunctivae normal.     Pupils: Pupils are equal, round, and reactive to light.  Neck:     Thyroid: No thyromegaly.  Cardiovascular:     Rate and Rhythm: Normal rate and regular rhythm.     Pulses: Intact distal pulses.     Heart sounds: Normal heart sounds.  Pulmonary:     Effort: Pulmonary effort is normal. No respiratory distress.     Breath sounds: Normal breath sounds. No wheezing or rales.  Abdominal:     General: Bowel sounds are normal. There is no distension.     Palpations: Abdomen is soft. There is no mass.     Tenderness: There is no abdominal tenderness. There is no guarding or rebound.  Musculoskeletal:     Cervical back: Neck supple.  Lymphadenopathy:     Cervical: No cervical adenopathy.  Skin:    General: Skin is warm and dry.     Findings: No rash.  Neurological:     Mental Status: She is alert and oriented to person, place, and time.     Cranial Nerves: No cranial nerve deficit.    Assessment/Plan: 1. Visit for preventive health examination Depression screen negative. Health Maintenance reviewed. Preventive schedule discussed and handout given in AVS. Will obtain fasting labs today. - CBC with Differential/Platelet - Comprehensive metabolic panel - Lipid panel - Hemoglobin A1c  2. Need for hepatitis C screening test - Hepatitis C Antibody  3. Colon cancer screening Discussed options. Average risk patient. Asymptomatic. Elects to proceed with colonoscopy. Referral to GI placed to get this set up.  - Ambulatory referral to Gastroenterology    This visit occurred during  the SARS-CoV-2 public health emergency.  Safety protocols were in place, including screening questions prior to the visit, additional usage of staff PPE, and extensive cleaning of exam room while observing appropriate contact time as indicated for disinfecting solutions.    Leeanne Rio, PA-C

## 2020-10-04 LAB — HEPATITIS C ANTIBODY
Hepatitis C Ab: NONREACTIVE
SIGNAL TO CUT-OFF: 0.01 (ref <1.00)

## 2021-03-05 ENCOUNTER — Encounter: Payer: Self-pay | Admitting: *Deleted

## 2021-03-07 ENCOUNTER — Other Ambulatory Visit: Payer: Self-pay

## 2021-03-07 ENCOUNTER — Ambulatory Visit (AMBULATORY_SURGERY_CENTER): Payer: Managed Care, Other (non HMO) | Admitting: *Deleted

## 2021-03-07 VITALS — Ht 69.0 in | Wt 154.0 lb

## 2021-03-07 DIAGNOSIS — Z1211 Encounter for screening for malignant neoplasm of colon: Secondary | ICD-10-CM

## 2021-03-07 MED ORDER — SUTAB 1479-225-188 MG PO TABS: 1.0000 | ORAL_TABLET | ORAL | 0 refills | Status: DC

## 2021-03-07 NOTE — Progress Notes (Signed)
Patient's pre-visit was done today over the phone with the patient due to COVID-19 pandemic. Name,DOB and address verified. Insurance verified. Patient denies any allergies to Eggs and Soy. Patient denies any problems with anesthesia/sedation. Patient denies taking diet pills or blood thinners. Packet of Prep instructions mailed to patient including a copy of a consent form-pt is aware. Patient understands to call us back with any questions or concerns. Patient is aware of our care-partner policy and [id number] safety protocol.   EMMI education assigned to the patient for the procedure, sent to [facility].   The patient is COVID-19 vaccinated, per patient.

## 2021-03-21 ENCOUNTER — Ambulatory Visit (AMBULATORY_SURGERY_CENTER): Payer: Managed Care, Other (non HMO) | Admitting: Gastroenterology

## 2021-03-21 ENCOUNTER — Other Ambulatory Visit: Payer: Self-pay

## 2021-03-21 ENCOUNTER — Encounter: Payer: Self-pay | Admitting: Gastroenterology

## 2021-03-21 VITALS — BP 116/82 | HR 63 | Temp 97.3°F | Resp 15 | Ht 69.0 in | Wt 154.0 lb

## 2021-03-21 DIAGNOSIS — K6289 Other specified diseases of anus and rectum: Secondary | ICD-10-CM

## 2021-03-21 DIAGNOSIS — Z1211 Encounter for screening for malignant neoplasm of colon: Secondary | ICD-10-CM

## 2021-03-21 DIAGNOSIS — D123 Benign neoplasm of transverse colon: Secondary | ICD-10-CM

## 2021-03-21 MED ORDER — SODIUM CHLORIDE 0.9 % IV SOLN: 500.0000 mL | Freq: Once | INTRAVENOUS | Status: DC

## 2021-03-21 NOTE — Progress Notes (Signed)
A and O x3. Report to RN. Tolerated MAC anesthesia well. 

## 2021-03-21 NOTE — Progress Notes (Signed)
C.W. vital signs. 

## 2021-03-21 NOTE — Op Note (Signed)
[facility] Patient Name: Catherine Camacho Procedure Date: 03/21/2021 8:44 AM MRN: [id number] Endoscopist: Remo Lipps P. Havery Moros , MD Age: 44 Referring MD:  Date of Birth: 03-25-1976 Gender: Female Account #: [id number] Procedure:                Colonoscopy Indications:              Screening for colorectal malignant neoplasm, This                            is the patient's first colonoscopy Medicines:                Monitored Anesthesia Care Procedure:                Pre-Anesthesia Assessment:                           - Prior to the procedure, a History and Physical                            was performed, and patient medications and                            allergies were reviewed. The patient's tolerance of                            previous anesthesia was also reviewed. The risks                            and benefits of the procedure and the sedation                            options and risks were discussed with the patient.                            All questions were answered, and informed consent                            was obtained. Prior Anticoagulants: The patient has                            taken no previous anticoagulant or antiplatelet                            agents. ASA Grade Assessment: II - A patient with                            mild systemic disease. After reviewing the risks                            and benefits, the patient was deemed in                            satisfactory condition to undergo the procedure.  After obtaining informed consent, the colonoscope                            was passed under direct vision. Throughout the                            procedure, the patient's blood pressure, pulse, and                            oxygen saturations were monitored continuously. The                            Olympus PCF-H190DL (#[id number]) Colonoscope was                            introduced through the  anus and advanced to the the                            cecum, identified by appendiceal orifice and                            ileocecal valve. The colonoscopy was performed                            without difficulty. The patient tolerated the                            procedure well. The quality of the bowel                            preparation was good. The ileocecal valve,                            appendiceal orifice, and rectum were photographed. Scope In: 8:51:03 AM Scope Out: 9:17:50 AM Scope Withdrawal Time: 0 hours 21 minutes 5 seconds  Total Procedure Duration: 0 hours 26 minutes 47 seconds  Findings:                 Small skin tags were found on perianal exam.                           A 3 to 4 mm polyp was found in the transverse                            colon. The polyp was sessile. The polyp was removed                            with a cold snare. Resection and retrieval were                            complete.                           The colon was tortuous.  An anal papillae was hypertrophied. Biopsies were                            taken with a cold forceps for histology to rule out                            AIN.                           Internal hemorrhoids were found during                            retroflexion, they were small.                           The exam was otherwise without abnormality. Complications:            No immediate complications. Estimated blood loss:                            Minimal. Estimated Blood Loss:     Estimated blood loss was minimal. Impression:               - Perianal skin tags found on perianal exam.                           - One 3 to 4 mm polyp in the transverse colon,                            removed with a cold snare. Resected and retrieved.                           - Tortuous colon.                           - Anal papillae was hypertrophied. Biopsied to rule                             out AIN.                           - Internal hemorrhoids.                           - The examination was otherwise normal. Recommendation:           - Patient has a contact number available for                            emergencies. The signs and symptoms of potential                            delayed complications were discussed with the                            patient. Return to normal activities tomorrow.  Written discharge instructions were provided to the                            patient.                           - Resume previous diet.                           - Continue present medications.                           - Await pathology results. Remo Lipps P. Doneisha Ivey, MD 03/21/2021 9:22:41 AM This report has been signed electronically.

## 2021-03-21 NOTE — Patient Instructions (Signed)
Handout on polyps & hemorrhoids  given to you today  Await pathology results on polyp removed today & on biopsies done    YOU HAD AN ENDOSCOPIC PROCEDURE TODAY AT [facility]:   Refer to the procedure report that was given to you for any specific questions about what was found during the examination.  If the procedure report does not answer your questions, please call your gastroenterologist to clarify.  If you requested that your care partner not be given the details of your procedure findings, then the procedure report has been included in a sealed envelope for you to review at your convenience later.  YOU SHOULD EXPECT: Some feelings of bloating in the abdomen. Passage of more gas than usual.  Walking can help get rid of the air that was put into your GI tract during the procedure and reduce the bloating. If you had a lower endoscopy (such as a colonoscopy or flexible sigmoidoscopy) you may notice spotting of blood in your stool or on the toilet paper. If you underwent a bowel prep for your procedure, you may not have a normal bowel movement for a few days.  Please Note:  You might notice some irritation and congestion in your nose or some drainage.  This is from the oxygen used during your procedure.  There is no need for concern and it should clear up in a day or so.  SYMPTOMS TO REPORT IMMEDIATELY:  Following lower endoscopy (colonoscopy or flexible sigmoidoscopy):  Excessive amounts of blood in the stool  Significant tenderness or worsening of abdominal pains  Swelling of the abdomen that is new, acute  Fever of 100F or higher   For urgent or emergent issues, a gastroenterologist can be reached at any hour by calling [phone]. Do not use MyChart messaging for urgent concerns.    DIET:  We do recommend a small meal at first, but then you may proceed to your regular diet.  Drink plenty of fluids but you should avoid alcoholic beverages for 24  hours.  ACTIVITY:  You should plan to take it easy for the rest of today and you should NOT DRIVE or use heavy machinery until tomorrow (because of the sedation medicines used during the test).    FOLLOW UP: Our staff will call the number listed on your records 48-72 hours following your procedure to check on you and address any questions or concerns that you may have regarding the information given to you following your procedure. If we do not reach you, we will leave a message.  We will attempt to reach you two times.  During this call, we will ask if you have developed any symptoms of COVID 19. If you develop any symptoms (ie: fever, flu-like symptoms, shortness of breath, cough etc.) before then, please call [phone].  If you test positive for Covid 19 in the 2 weeks post procedure, please call and report this information to [place].    If any biopsies were taken you will be contacted by phone or by letter within the next 1-3 weeks.  Please call us at [phone] if you have not heard about the biopsies in 3 weeks.    SIGNATURES/CONFIDENTIALITY: You and/or your care partner have signed paperwork which will be entered into your electronic medical record.  These signatures attest to the fact that that the information above on your After Visit Summary has been reviewed and is understood.  Full responsibility of the confidentiality of this discharge  information lies with you and/or your care-partner.

## 2021-03-21 NOTE — Progress Notes (Signed)
Pt's states no medical or surgical changes since previsit or office visit. 

## 2021-03-25 ENCOUNTER — Telehealth: Payer: Self-pay | Admitting: *Deleted

## 2021-03-25 NOTE — Telephone Encounter (Signed)
  Follow up Call-  Call back number 03/21/2021  Post procedure Call Back phone  # [phone]  Permission to leave phone message Yes  Some recent data might be hidden     Patient questions:  Do you have a fever, pain , or abdominal swelling? No. Pain Score  0 *  Have you tolerated food without any problems? Yes.    Have you been able to return to your normal activities? Yes.    Do you have any questions about your discharge instructions: Diet   No. Medications  No. Follow up visit  No.  Do you have questions or concerns about your Care? No.  Actions: * If pain score is 4 or above: No action needed, pain <4.  Have you developed a fever since your procedure? no  2.   Have you had an respiratory symptoms (SOB or cough) since your procedure? no  3.   Have you tested positive for COVID 19 since your procedure no  4.   Have you had any family members/close contacts diagnosed with the COVID 19 since your procedure?  no   If yes to any of these questions please route to Joylene John, RN and Joella Prince, RN

## 2021-04-15 LAB — HM MAMMOGRAPHY

## 2021-10-07 ENCOUNTER — Encounter: Payer: Self-pay | Admitting: Family Medicine

## 2021-10-07 ENCOUNTER — Ambulatory Visit (INDEPENDENT_AMBULATORY_CARE_PROVIDER_SITE_OTHER): Payer: Managed Care, Other (non HMO) | Admitting: Family Medicine

## 2021-10-07 VITALS — BP 118/78 | HR 73 | Temp 97.7°F | Resp 16 | Ht 69.0 in | Wt 150.4 lb

## 2021-10-07 DIAGNOSIS — F419 Anxiety disorder, unspecified: Secondary | ICD-10-CM

## 2021-10-07 DIAGNOSIS — F32A Depression, unspecified: Secondary | ICD-10-CM | POA: Diagnosis not present

## 2021-10-07 DIAGNOSIS — E559 Vitamin D deficiency, unspecified: Secondary | ICD-10-CM | POA: Diagnosis not present

## 2021-10-07 DIAGNOSIS — E7800 Pure hypercholesterolemia, unspecified: Secondary | ICD-10-CM

## 2021-10-07 DIAGNOSIS — Z0000 Encounter for general adult medical examination without abnormal findings: Secondary | ICD-10-CM | POA: Diagnosis not present

## 2021-10-07 LAB — HEPATIC FUNCTION PANEL
ALT: 11 U/L (ref 0–35)
AST: 12 U/L (ref 0–37)
Albumin: 4.1 g/dL (ref 3.5–5.2)
Alkaline Phosphatase: 43 U/L (ref 39–117)
Bilirubin, Direct: 0.1 mg/dL (ref 0.0–0.3)
Total Bilirubin: 0.5 mg/dL (ref 0.2–1.2)
Total Protein: 6.5 g/dL (ref 6.0–8.3)

## 2021-10-07 LAB — BASIC METABOLIC PANEL
BUN: 15 mg/dL (ref 6–23)
CO2: 29 mEq/L (ref 19–32)
Calcium: 9 mg/dL (ref 8.4–10.5)
Chloride: 103 mEq/L (ref 96–112)
Creatinine, Ser: 0.76 mg/dL (ref 0.40–1.20)
GFR: 93.17 mL/min (ref >60.00)
Glucose, Bld: 95 mg/dL (ref 70–99)
Potassium: 4.3 mEq/L (ref 3.5–5.1)
Sodium: 137 mEq/L (ref 135–145)

## 2021-10-07 LAB — CBC WITH DIFFERENTIAL/PLATELET
Basophils Absolute: 0 10*3/uL (ref 0.0–0.1)
Basophils Relative: 0.8 % (ref 0.0–3.0)
Eosinophils Absolute: 0.1 10*3/uL (ref 0.0–0.7)
Eosinophils Relative: 1.9 % (ref 0.0–5.0)
HCT: 39.1 % (ref 36.0–46.0)
Hemoglobin: 13.1 g/dL (ref 12.0–15.0)
Lymphocytes Relative: 35.1 % (ref 12.0–46.0)
Lymphs Abs: 2.1 10*3/uL (ref 0.7–4.0)
MCHC: 33.5 g/dL (ref 30.0–36.0)
MCV: 92.6 fl (ref 78.0–100.0)
Monocytes Absolute: 0.7 10*3/uL (ref 0.1–1.0)
Monocytes Relative: 11.3 % (ref 3.0–12.0)
Neutro Abs: 3 10*3/uL (ref 1.4–7.7)
Neutrophils Relative %: 50.9 % (ref 43.0–77.0)
Platelets: 248 10*3/uL (ref 150.0–400.0)
RBC: 4.23 Mil/uL (ref 3.87–5.11)
RDW: 13.7 % (ref 11.5–15.5)
WBC: 5.9 10*3/uL (ref 4.0–10.5)

## 2021-10-07 LAB — LIPID PANEL
Cholesterol: 170 mg/dL (ref 0–200)
HDL: 60.7 mg/dL (ref >39.00)
LDL Cholesterol: 95 mg/dL (ref 0–99)
NonHDL: 109.41
Total CHOL/HDL Ratio: 3
Triglycerides: 72 mg/dL (ref 0.0–149.0)
VLDL: 14.4 mg/dL (ref 0.0–40.0)

## 2021-10-07 MED ORDER — FLUOXETINE HCL 10 MG PO TABS: 10.0000 mg | ORAL_TABLET | Freq: Every day | ORAL | 3 refills | Status: DC

## 2021-10-07 NOTE — Assessment & Plan Note (Signed)
Check labs and replete prn. 

## 2021-10-07 NOTE — Assessment & Plan Note (Signed)
Pt's PE WNL.  UTD on mammo, colonoscopy, immunizations.  Check labs.  Anticipatory guidance provided.  

## 2021-10-07 NOTE — Patient Instructions (Addendum)
Follow up in 4-6 weeks to recheck mood We'll notify you of your lab results and make any changes if needed Keep up the good work on healthy diet and regular exercise- you look great! Start the Fluoxetine once daily- take w/ food Call with any questions or concerns Stay Safe!  Stay Healthy! Hang in there!!!

## 2021-10-07 NOTE — Assessment & Plan Note (Signed)
Deteriorated.  Pt reports feeling overwhelmed, tearful, and increasingly irritable.  She is having issues staying asleep.  Did not tolerate Lexapro in the past as this made her feel numb.  Will start low dose Fluoxetine and monitor closely for mood improvement.  Pt expressed understanding and is in agreement w/ plan.

## 2021-10-07 NOTE — Progress Notes (Signed)
° °  Subjective:    Patient ID: Catherine Camacho, female    DOB: 1976-04-07, 46 y.o.   MRN: [id number]  HPI CPE- UTD on mammo, Tdap, colonoscopy, flu.  Patient Care Team    Relationship Specialty Notifications Start End  Midge Minium, MD PCP - General Family Medicine  07/17/16   Servando Salina, MD Consulting Physician Obstetrics and Gynecology  07/17/16     Health Maintenance  Topic Date Due   COVID-19 Vaccine (3 - Booster for Pfizer series) 01/20/2020   MAMMOGRAM  04/15/2022   TETANUS/TDAP  07/17/2024   COLONOSCOPY (Pts 45-31yrs Insurance coverage will need to be confirmed)  03/21/2028   INFLUENZA VACCINE  Completed   Hepatitis C Screening  Completed   HIV Screening  Completed   HPV VACCINES  Aged Out      Review of Systems Patient reports no vision/ hearing changes, adenopathy,fever, weight change, persistant/recurrent hoarseness , swallowing issues, chest pain, palpitations, edema, persistant/recurrent cough, hemoptysis, dyspnea (rest/exertional/paroxysmal nocturnal), gastrointestinal bleeding (melena, rectal bleeding), abdominal pain, significant heartburn, bowel changes, GU symptoms (dysuria, hematuria, incontinence), Gyn symptoms (abnormal  bleeding, pain),  syncope, focal weakness, memory loss, numbness & tingling, skin/hair/nail changes, abnormal bruising or bleeding  + anxiety- pt reports feeling like an 'emotional roller coaster'.  Able to fall asleep but not able to stay asleep.  Some irritability.  Feels overwhelmed and tearful.  This visit occurred during the SARS-CoV-2 public health emergency.  Safety protocols were in place, including screening questions prior to the visit, additional usage of staff PPE, and extensive cleaning of exam room while observing appropriate contact time as indicated for disinfecting solutions.      Objective:   Physical Exam General Appearance:    Alert, cooperative, no distress, appears stated age  Head:    Normocephalic, without  obvious abnormality, atraumatic  Eyes:    PERRL, conjunctiva/corneas clear, EOM's intact, fundi    benign, both eyes  Ears:    Normal TM's and external ear canals, both ears  Nose:   Deferred due to COVID  Throat:   Neck:   Supple, symmetrical, trachea midline, no adenopathy;    Thyroid: no enlargement/tenderness/nodules  Back:     Symmetric, no curvature, ROM normal, no CVA tenderness  Lungs:     Clear to auscultation bilaterally, respirations unlabored  Chest Wall:    No tenderness or deformity   Heart:    Regular rate and rhythm, S1 and S2 normal, no murmur, rub   or gallop  Breast Exam:    Deferred to mammo  Abdomen:     Soft, non-tender, bowel sounds active all four quadrants,    no masses, no organomegaly  Genitalia:    Deferred to GYN  Rectal:    Extremities:   Extremities normal, atraumatic, no cyanosis or edema  Pulses:   2+ and symmetric all extremities  Skin:   Skin color, texture, turgor normal, no rashes or lesions  Lymph nodes:   Cervical, supraclavicular, and axillary nodes normal  Neurologic:   CNII-XII intact, normal strength, sensation and reflexes    throughout          Assessment & Plan:

## 2021-10-08 ENCOUNTER — Telehealth: Payer: Self-pay

## 2021-10-08 LAB — TSH: TSH: 2.56 u[IU]/mL (ref 0.35–5.50)

## 2021-10-08 LAB — VITAMIN D 25 HYDROXY (VIT D DEFICIENCY, FRACTURES): VITD: 43.39 ng/mL (ref 30.00–100.00)

## 2021-10-08 NOTE — Telephone Encounter (Signed)
LVM for patient to return call. 

## 2021-10-08 NOTE — Telephone Encounter (Signed)
-----   Message from Midge Minium, MD sent at 10/08/2021  7:38 AM EST ----- Labs look great!  No changes at this time

## 2021-10-09 NOTE — Telephone Encounter (Signed)
Spoke to patient, she is aware of the labs

## 2021-11-03 ENCOUNTER — Encounter: Payer: Self-pay | Admitting: Family Medicine

## 2021-11-10 ENCOUNTER — Ambulatory Visit: Payer: Managed Care, Other (non HMO) | Admitting: Family Medicine

## 2022-07-01 ENCOUNTER — Telehealth: Payer: Managed Care, Other (non HMO) | Admitting: Emergency Medicine

## 2022-07-01 DIAGNOSIS — J069 Acute upper respiratory infection, unspecified: Secondary | ICD-10-CM | POA: Diagnosis not present

## 2022-07-01 MED ORDER — AZELASTINE HCL 0.1 % NA SOLN: 2.0000 | Freq: Two times a day (BID) | NASAL | 0 refills | Status: DC

## 2022-07-01 MED ORDER — BENZONATATE 100 MG PO CAPS: 100.0000 mg | ORAL_CAPSULE | Freq: Two times a day (BID) | ORAL | 0 refills | Status: DC | PRN

## 2022-07-01 NOTE — Progress Notes (Signed)
E-Visit for Upper Respiratory Infection   We are sorry you are not feeling well.  Here is how we plan to help!  Let me know if you need a note for work.   Based on what you have shared with me, it looks like you may have a viral upper respiratory infection.  Upper respiratory infections are caused by a large number of viruses; however, rhinovirus is the most common cause.   Symptoms vary from person to person, with common symptoms including sore throat, cough, fatigue or lack of energy and feeling of general discomfort.  A low-grade fever of up to 100.4 may present, but is often uncommon.  Symptoms vary however, and are closely related to a person's age or underlying illnesses.  The most common symptoms associated with an upper respiratory infection are nasal discharge or congestion, cough, sneezing, headache and pressure in the ears and face.  These symptoms usually persist for about 3 to 10 days, but can last up to 2 weeks.  It is important to know that upper respiratory infections do not cause serious illness or complications in most cases.    Upper respiratory infections can be transmitted from person to person, with the most common method of transmission being a person's hands.  The virus is able to live on the skin and can infect other persons for up to 2 hours after direct contact.  Also, these can be transmitted when someone coughs or sneezes; thus, it is important to cover the mouth to reduce this risk.  To keep the spread of the illness at [facility], good hand hygiene is very important.  This is an infection that is most likely caused by a virus. There are no specific treatments other than to help you with the symptoms until the infection runs its course.  We are sorry you are not feeling well.  Here is how we plan to help!   For nasal congestion, you may use an oral decongestants such as Mucinex D or if you have glaucoma or high blood pressure use plain Mucinex.    Saline nasal spray or nasal  drops can help and can safely be used as often as needed for congestion.  Try using saline irrigation, such as with a neti pot, several times a day while you are sick. Many neti pots come with salt packets premeasured to use to make saline. If you use your own salt, make sure it is kosher salt or sea salt (don't use table salt as it has iodine in it and you don't need that in your nose). Use distilled water to make saline. If you mix your own saline using your own salt, the recipe is 1/4 teaspoon salt in 1 cup warm water. Using saline irrigation can help prevent and treat sinus infections.   For your congestion, I have prescribed Azelastine nasal spray two sprays in each nostril twice a day  If you do not have a history of heart disease, hypertension, diabetes or thyroid disease, prostate/bladder issues or glaucoma, you may also use Sudafed to treat nasal congestion.  It is highly recommended that you consult with a pharmacist or your primary care physician to ensure this medication is safe for you to take.     If you have a cough, you may use cough suppressants such as Delsym and Robitussin.  If you have glaucoma or high blood pressure, you can also use Coricidin HBP.   For cough I have prescribed for you A prescription cough medication  called Tessalon Perles 100 mg. You may take 1-2 capsules every 8 hours as needed for cough  If you have a sore or scratchy throat, use a saltwater gargle-  to  teaspoon of salt dissolved in a 4-ounce to 8-ounce glass of warm water.  Gargle the solution for approximately 15-30 seconds and then spit.  It is important not to swallow the solution.  Gargling with salt water can also help your hoarse voice.    You can also use throat lozenges/cough drops and Chloraseptic spray to help with throat pain or discomfort.  Warm or cold liquids can also be helpful in relieving throat pain.  For headache, pain or general discomfort, you can use Ibuprofen or Tylenol as directed.    Some authorities believe that zinc sprays or the use of Echinacea may shorten the course of your symptoms.   HOME CARE Only take medications as instructed by your medical team. Be sure to drink plenty of fluids. Water is fine as well as fruit juices, sodas and electrolyte beverages. You may want to stay away from caffeine or alcohol. If you are nauseated, try taking small sips of liquids. How do you know if you are getting enough fluid? Your urine should be a pale yellow or almost colorless. Get rest. Taking a steamy shower or using a humidifier may help nasal congestion and ease sore throat pain. You can place a towel over your head and breathe in the steam from hot water coming from a faucet. Using a saline nasal spray works much the same way. Cough drops, hard candies and sore throat lozenges may ease your cough. Avoid close contacts especially the very young and the elderly Cover your mouth if you cough or sneeze Always remember to wash your hands.   GET HELP RIGHT AWAY IF: You develop worsening fever. If your symptoms do not improve within 10 days You develop yellow or green discharge from your nose over 3 days. You have coughing fits You develop a severe head ache or visual changes. You develop shortness of breath, difficulty breathing or start having chest pain Your symptoms persist after you have completed your treatment plan  MAKE SURE YOU  Understand these instructions. Will watch your condition. Will get help right away if you are not doing well or get worse.  Thank you for choosing an e-visit.  Your e-visit answers were reviewed by a board certified advanced clinical practitioner to complete your personal care plan. Depending upon the condition, your plan could have included both over the counter or prescription medications.  Please review your pharmacy choice. Make sure the pharmacy is open so you can pick up prescription now. If there is a problem, you may contact your  provider through [employer] and have the prescription routed to another pharmacy.  Your safety is important to [place]. If you have drug allergies check your prescription carefully.   For the next 24 hours you can use MyChart to ask questions about today's visit, request a non-urgent call back, or ask for a work or school excuse. You will get an email in the next two days asking about your experience. I hope that your e-visit has been valuable and will speed your recovery.  I have spent 5 minutes in review of e-visit questionnaire, review and updating patient chart, medical decision making and response to patient.   Willeen Cass, PhD, FNP-BC

## 2022-10-14 ENCOUNTER — Encounter: Payer: Self-pay | Admitting: Family Medicine

## 2022-10-14 ENCOUNTER — Ambulatory Visit (INDEPENDENT_AMBULATORY_CARE_PROVIDER_SITE_OTHER): Payer: Managed Care, Other (non HMO) | Admitting: Family Medicine

## 2022-10-14 VITALS — BP 130/80 | HR 70 | Temp 98.1°F | Resp 17 | Ht 69.0 in | Wt 163.1 lb

## 2022-10-14 DIAGNOSIS — E559 Vitamin D deficiency, unspecified: Secondary | ICD-10-CM | POA: Diagnosis not present

## 2022-10-14 DIAGNOSIS — E7800 Pure hypercholesterolemia, unspecified: Secondary | ICD-10-CM

## 2022-10-14 DIAGNOSIS — Z0000 Encounter for general adult medical examination without abnormal findings: Secondary | ICD-10-CM | POA: Diagnosis not present

## 2022-10-14 LAB — CBC WITH DIFFERENTIAL/PLATELET
Basophils Absolute: 0 10*3/uL (ref 0.0–0.1)
Basophils Relative: 0.7 % (ref 0.0–3.0)
Eosinophils Absolute: 0.4 10*3/uL (ref 0.0–0.7)
Eosinophils Relative: 5.4 % — ABNORMAL HIGH (ref 0.0–5.0)
HCT: 39.9 % (ref 36.0–46.0)
Hemoglobin: 13.7 g/dL (ref 12.0–15.0)
Lymphocytes Relative: 26.3 % (ref 12.0–46.0)
Lymphs Abs: 1.8 10*3/uL (ref 0.7–4.0)
MCHC: 34.3 g/dL (ref 30.0–36.0)
MCV: 89.8 fl (ref 78.0–100.0)
Monocytes Absolute: 0.8 10*3/uL (ref 0.1–1.0)
Monocytes Relative: 11.7 % (ref 3.0–12.0)
Neutro Abs: 3.9 10*3/uL (ref 1.4–7.7)
Neutrophils Relative %: 55.9 % (ref 43.0–77.0)
Platelets: 250 10*3/uL (ref 150.0–400.0)
RBC: 4.44 Mil/uL (ref 3.87–5.11)
RDW: 14.2 % (ref 11.5–15.5)
WBC: 6.9 10*3/uL (ref 4.0–10.5)

## 2022-10-14 LAB — BASIC METABOLIC PANEL
BUN: 18 mg/dL (ref 6–23)
CO2: 27 mEq/L (ref 19–32)
Calcium: 9.5 mg/dL (ref 8.4–10.5)
Chloride: 104 mEq/L (ref 96–112)
Creatinine, Ser: 0.82 mg/dL (ref 0.40–1.20)
GFR: 84.44 mL/min (ref >60.00)
Glucose, Bld: 100 mg/dL — ABNORMAL HIGH (ref 70–99)
Potassium: 4.4 mEq/L (ref 3.5–5.1)
Sodium: 140 mEq/L (ref 135–145)

## 2022-10-14 LAB — HEPATIC FUNCTION PANEL
ALT: 12 U/L (ref 0–35)
AST: 13 U/L (ref 0–37)
Albumin: 4.6 g/dL (ref 3.5–5.2)
Alkaline Phosphatase: 53 U/L (ref 39–117)
Bilirubin, Direct: 0.1 mg/dL (ref 0.0–0.3)
Total Bilirubin: 0.5 mg/dL (ref 0.2–1.2)
Total Protein: 7.4 g/dL (ref 6.0–8.3)

## 2022-10-14 LAB — TSH: TSH: 2.71 u[IU]/mL (ref 0.35–5.50)

## 2022-10-14 LAB — LIPID PANEL
Cholesterol: 214 mg/dL — ABNORMAL HIGH (ref 0–200)
HDL: 61.7 mg/dL (ref >39.00)
LDL Cholesterol: 131 mg/dL — ABNORMAL HIGH (ref 0–99)
NonHDL: 152.18
Total CHOL/HDL Ratio: 3
Triglycerides: 105 mg/dL (ref 0.0–149.0)
VLDL: 21 mg/dL (ref 0.0–40.0)

## 2022-10-14 LAB — VITAMIN D 25 HYDROXY (VIT D DEFICIENCY, FRACTURES): VITD: 31.67 ng/mL (ref 30.00–100.00)

## 2022-10-14 MED ORDER — FLUOXETINE HCL 10 MG PO TABS: 10.0000 mg | ORAL_TABLET | Freq: Every day | ORAL | 3 refills | Status: DC

## 2022-10-14 NOTE — Progress Notes (Signed)
   Subjective:    Patient ID: Catherine Camacho, female    DOB: May 23, 1976, 47 y.o.   MRN: [id number]  HPI CPE- UTD on mammo, colonoscopy, Tdap  Patient Care Team    Relationship Specialty Notifications Start End  Midge Minium, MD PCP - General Family Medicine  07/17/16   Servando Salina, MD Consulting Physician Obstetrics and Gynecology  07/17/16      Health Maintenance  Topic Date Due   INFLUENZA VACCINE  12/06/2022 (Originally 04/07/2022)   MAMMOGRAM  04/30/2023   DTaP/Tdap/Td (2 - Td or Tdap) 07/17/2024   COLONOSCOPY (Pts 45-41yr Insurance coverage will need to be confirmed)  03/21/2028   Hepatitis C Screening  Completed   HIV Screening  Completed   HPV VACCINES  Aged Out   COVID-19 Vaccine  Discontinued     Review of Systems Patient reports no hearing changes, adenopathy,fever,  persistant/recurrent hoarseness , swallowing issues, chest pain, edema, persistant/recurrent cough, hemoptysis, dyspnea (rest/exertional/paroxysmal nocturnal), gastrointestinal bleeding (melena, rectal bleeding), abdominal pain, significant heartburn, bowel changes, GU symptoms (dysuria, hematuria, incontinence), Gyn symptoms (abnormal  bleeding, pain),  syncope, focal weakness, memory loss, numbness & tingling, skin/hair/nail changes, abnormal bruising or bleeding, anxiety, or depression.   + 13 lb weight gain + readers + palpitations    Objective:   Physical Exam General Appearance:    Alert, cooperative, no distress, appears stated age  Head:    Normocephalic, without obvious abnormality, atraumatic  Eyes:    PERRL, conjunctiva/corneas clear, EOM's intact both eyes  Ears:    Normal TM's and external ear canals, both ears  Nose:   Nares normal, septum midline, mucosa normal, no drainage    or sinus tenderness  Throat:   Lips, mucosa, and tongue normal; teeth and gums normal  Neck:   Supple, symmetrical, trachea midline, no adenopathy;    Thyroid: no enlargement/tenderness/nodules   Back:     Symmetric, no curvature, ROM normal, no CVA tenderness  Lungs:     Clear to auscultation bilaterally, respirations unlabored  Chest Wall:    No tenderness or deformity   Heart:    Regular rate and rhythm, S1 and S2 normal, no murmur, rub   or gallop  Breast Exam:    Deferred to mammo  Abdomen:     Soft, non-tender, bowel sounds active all four quadrants,    no masses, no organomegaly  Genitalia:    Deferred  Rectal:    Extremities:   Extremities normal, atraumatic, no cyanosis or edema  Pulses:   2+ and symmetric all extremities  Skin:   Skin color, texture, turgor normal, no rashes or lesions  Lymph nodes:   Cervical, supraclavicular, and axillary nodes normal  Neurologic:   CNII-XII intact, normal strength, sensation and reflexes    throughout          Assessment & Plan:

## 2022-10-14 NOTE — Assessment & Plan Note (Signed)
Check labs and replete prn. 

## 2022-10-14 NOTE — Patient Instructions (Addendum)
Follow up in 1 year or as needed We'll notify you of your lab results and make any changes if needed Continue to work on healthy diet and regular exercise- you look great!!! Call with any questions or concerns Stay Safe!  Stay Healthy! Happy Valentine's Day!!!

## 2022-10-14 NOTE — Assessment & Plan Note (Signed)
Pt's PE WNL.  UTD on mammo, colonoscopy, Tdap.  Check labs.  Anticipatory guidance provided.  

## 2022-10-15 ENCOUNTER — Telehealth: Payer: Self-pay

## 2022-10-15 NOTE — Telephone Encounter (Signed)
-----   Message from Midge Minium, MD sent at 10/15/2022  7:41 AM EST ----- Total cholesterol and LDL (bad cholesterol) have both increased.  Thankfully the ratio of good to bad is still excellent!  No need for meds at this time.  Continue to work on healthy diet and regular exercise.

## 2022-10-15 NOTE — Telephone Encounter (Signed)
Left lab result on pt VM

## 2022-11-27 ENCOUNTER — Other Ambulatory Visit (HOSPITAL_BASED_OUTPATIENT_CLINIC_OR_DEPARTMENT_OTHER): Payer: Self-pay

## 2022-11-27 MED ORDER — LISDEXAMFETAMINE DIMESYLATE 40 MG PO CHEW
40.0000 mg | CHEWABLE_TABLET | Freq: Every morning | ORAL | 0 refills | Status: DC
  Filled 2022-11-27: qty 30, 30d supply, fill #0

## 2022-11-29 ENCOUNTER — Other Ambulatory Visit: Payer: Self-pay

## 2022-12-07 ENCOUNTER — Other Ambulatory Visit (HOSPITAL_BASED_OUTPATIENT_CLINIC_OR_DEPARTMENT_OTHER): Payer: Self-pay

## 2022-12-10 ENCOUNTER — Other Ambulatory Visit: Payer: Self-pay

## 2022-12-10 ENCOUNTER — Other Ambulatory Visit (HOSPITAL_BASED_OUTPATIENT_CLINIC_OR_DEPARTMENT_OTHER): Payer: Self-pay

## 2023-03-01 ENCOUNTER — Encounter: Payer: Self-pay | Admitting: Family Medicine

## 2023-03-30 ENCOUNTER — Ambulatory Visit: Payer: Managed Care, Other (non HMO) | Admitting: Family Medicine

## 2023-03-30 ENCOUNTER — Encounter: Payer: Self-pay | Admitting: Family Medicine

## 2023-03-30 VITALS — BP 108/72 | HR 77 | Temp 97.8°F | Resp 19 | Ht 69.0 in | Wt 167.0 lb

## 2023-03-30 DIAGNOSIS — N951 Menopausal and female climacteric states: Secondary | ICD-10-CM

## 2023-03-30 MED ORDER — BUPROPION HCL 75 MG PO TABS: 75.0000 mg | ORAL_TABLET | Freq: Two times a day (BID) | ORAL | 3 refills | Status: DC

## 2023-03-30 NOTE — Progress Notes (Signed)
   Subjective:    Patient ID: Catherine Camacho, female    DOB: 03-02-1976, 47 y.o.   MRN: [id number]  HPI Menopausal symptoms- moody, not sleeping well, weight gain.  Interested in what's next.  Tearful in office.  Friend is doing well on Wellbutrin.  Hot flashes are not a big concern for her.   Review of Systems For ROS see HPI     Objective:   Physical Exam Vitals reviewed.  Constitutional:      General: She is not in acute distress.    Appearance: Normal appearance. She is not ill-appearing.  HENT:     Head: Normocephalic and atraumatic.  Eyes:     Extraocular Movements: Extraocular movements intact.     Conjunctiva/sclera: Conjunctivae normal.  Cardiovascular:     Rate and Rhythm: Normal rate.  Pulmonary:     Effort: Pulmonary effort is normal. No respiratory distress.  Skin:    General: Skin is warm and dry.  Neurological:     General: No focal deficit present.     Mental Status: She is alert and oriented to person, place, and time.  Psychiatric:        Behavior: Behavior normal.        Thought Content: Thought content normal.     Comments: tearful           Assessment & Plan:

## 2023-03-30 NOTE — Assessment & Plan Note (Signed)
New.  Pt reports she is struggling w/ weight gain, mild hot flashes, fatigue, and most importantly- mood lability.  Was tearful in office today.  Has 2 friends on Wellbutrin for similar sxs and they are doing well.  Will start low dose Wellbutrin and monitor closely for improvement and titrate medications prn.

## 2023-03-30 NOTE — Patient Instructions (Signed)
Follow up in 4-6 weeks to recheck mood START the Wellbutrin 75mg  daily.  You can increase to twice daily if needed Keep up the good work on healthy diet and regular exercise- you're doing great!!! Call with any questions or concerns Stay Safe!  Stay Healthy! Hang in there!!!

## 2023-04-20 LAB — HM MAMMOGRAPHY

## 2023-04-22 ENCOUNTER — Encounter (INDEPENDENT_AMBULATORY_CARE_PROVIDER_SITE_OTHER): Payer: Self-pay

## 2023-05-12 ENCOUNTER — Ambulatory Visit: Payer: Managed Care, Other (non HMO) | Admitting: Family Medicine

## 2023-08-02 ENCOUNTER — Other Ambulatory Visit: Payer: Self-pay

## 2023-08-02 ENCOUNTER — Telehealth: Payer: Self-pay | Admitting: Family Medicine

## 2023-08-02 DIAGNOSIS — F419 Anxiety disorder, unspecified: Secondary | ICD-10-CM

## 2023-08-02 MED ORDER — BUPROPION HCL 75 MG PO TABS: 75.0000 mg | ORAL_TABLET | Freq: Two times a day (BID) | ORAL | 3 refills | Status: DC

## 2023-08-02 NOTE — Telephone Encounter (Signed)
Last refill 03/30/2023 60 2x 3 refills

## 2023-08-02 NOTE — Telephone Encounter (Signed)
Encourage patient to contact the pharmacy for refills or they can request refills through [facility]  (Please schedule appointment if patient has not been seen in over a year)    WHAT PHARMACY WOULD THEY LIKE THIS SENT TO: [id number] - [place], [place] - [address] AT [facility] OF [address]   MEDICATION NAME & DOSE: buPROPion (WELLBUTRIN) 75 MG tablet   NOTES/COMMENTS FROM PATIENT: Pt states that she heard of a time release option of this medication and was wondering if she could switch to that because she doesn't always remember to take the 2nd dose of her current prescription.     Front office please notify patient: It takes 48-72 hours to process rx refill requests Ask patient to call pharmacy to ensure rx is ready before heading there.

## 2023-08-02 NOTE — Telephone Encounter (Signed)
Pt called back to add that she only has 1 dose left before she is completely out

## 2023-08-02 NOTE — Telephone Encounter (Signed)
Left vm to inform pts refill has been sent

## 2023-08-03 ENCOUNTER — Other Ambulatory Visit: Payer: Self-pay

## 2023-08-03 DIAGNOSIS — F32A Depression, unspecified: Secondary | ICD-10-CM

## 2023-08-03 MED ORDER — BUPROPION HCL ER (XL) 150 MG PO TB24: 150.0000 mg | ORAL_TABLET | Freq: Every day | ORAL | 1 refills | Status: DC

## 2023-08-03 NOTE — Addendum Note (Signed)
Addended by: Sheliah Hatch on: 08/03/2023 10:35 AM   Modules accepted: Orders

## 2023-08-03 NOTE — Telephone Encounter (Signed)
Prescription sent for the extended release medication.  She can pick this one up rather than the twice daily dose that was sent in earlier

## 2023-08-03 NOTE — Telephone Encounter (Signed)
Called and let pt know

## 2023-08-03 NOTE — Telephone Encounter (Signed)
Patient requested the next refill be sent in as 150 XR due to sometimes forgetting second 75 mg dose for the day. Notes she will take the BID dose for another month but would really like to change if you dont have a reason not to do so.

## 2023-08-12 ENCOUNTER — Other Ambulatory Visit: Payer: Self-pay | Admitting: Family Medicine

## 2023-08-17 ENCOUNTER — Encounter: Payer: Self-pay | Admitting: Family Medicine

## 2023-10-18 ENCOUNTER — Ambulatory Visit: Payer: Managed Care, Other (non HMO) | Attending: Family Medicine

## 2023-10-18 ENCOUNTER — Encounter: Payer: Self-pay | Admitting: Family Medicine

## 2023-10-18 ENCOUNTER — Ambulatory Visit (INDEPENDENT_AMBULATORY_CARE_PROVIDER_SITE_OTHER): Payer: Managed Care, Other (non HMO) | Admitting: Family Medicine

## 2023-10-18 VITALS — BP 122/70 | HR 69 | Temp 97.9°F | Ht 69.0 in | Wt 163.1 lb

## 2023-10-18 DIAGNOSIS — R002 Palpitations: Secondary | ICD-10-CM

## 2023-10-18 DIAGNOSIS — E559 Vitamin D deficiency, unspecified: Secondary | ICD-10-CM

## 2023-10-18 DIAGNOSIS — E7800 Pure hypercholesterolemia, unspecified: Secondary | ICD-10-CM

## 2023-10-18 DIAGNOSIS — Z0001 Encounter for general adult medical examination with abnormal findings: Secondary | ICD-10-CM | POA: Diagnosis not present

## 2023-10-18 DIAGNOSIS — Z0000 Encounter for general adult medical examination without abnormal findings: Secondary | ICD-10-CM

## 2023-10-18 LAB — HEPATIC FUNCTION PANEL
ALT: 15 U/L (ref 0–35)
AST: 18 U/L (ref 0–37)
Albumin: 4.4 g/dL (ref 3.5–5.2)
Alkaline Phosphatase: 60 U/L (ref 39–117)
Bilirubin, Direct: 0.1 mg/dL (ref 0.0–0.3)
Total Bilirubin: 0.4 mg/dL (ref 0.2–1.2)
Total Protein: 7.1 g/dL (ref 6.0–8.3)

## 2023-10-18 LAB — LIPID PANEL
Cholesterol: 206 mg/dL — ABNORMAL HIGH (ref 0–200)
HDL: 68.4 mg/dL (ref >39.00)
LDL Cholesterol: 121 mg/dL — ABNORMAL HIGH (ref 0–99)
NonHDL: 137.11
Total CHOL/HDL Ratio: 3
Triglycerides: 79 mg/dL (ref 0.0–149.0)
VLDL: 15.8 mg/dL (ref 0.0–40.0)

## 2023-10-18 LAB — CBC WITH DIFFERENTIAL/PLATELET
Basophils Absolute: 0 10*3/uL (ref 0.0–0.1)
Basophils Relative: 0.6 % (ref 0.0–3.0)
Eosinophils Absolute: 0.2 10*3/uL (ref 0.0–0.7)
Eosinophils Relative: 2.5 % (ref 0.0–5.0)
HCT: 41.2 % (ref 36.0–46.0)
Hemoglobin: 13.7 g/dL (ref 12.0–15.0)
Lymphocytes Relative: 40.2 % (ref 12.0–46.0)
Lymphs Abs: 2.6 10*3/uL (ref 0.7–4.0)
MCHC: 33.1 g/dL (ref 30.0–36.0)
MCV: 93.3 fL (ref 78.0–100.0)
Monocytes Absolute: 0.8 10*3/uL (ref 0.1–1.0)
Monocytes Relative: 11.9 % (ref 3.0–12.0)
Neutro Abs: 2.8 10*3/uL (ref 1.4–7.7)
Neutrophils Relative %: 44.8 % (ref 43.0–77.0)
Platelets: 264 10*3/uL (ref 150.0–400.0)
RBC: 4.42 Mil/uL (ref 3.87–5.11)
RDW: 13.5 % (ref 11.5–15.5)
WBC: 6.3 10*3/uL (ref 4.0–10.5)

## 2023-10-18 LAB — BASIC METABOLIC PANEL
BUN: 16 mg/dL (ref 6–23)
CO2: 28 meq/L (ref 19–32)
Calcium: 9 mg/dL (ref 8.4–10.5)
Chloride: 103 meq/L (ref 96–112)
Creatinine, Ser: 0.79 mg/dL (ref 0.40–1.20)
GFR: 87.68 mL/min (ref >60.00)
Glucose, Bld: 90 mg/dL (ref 70–99)
Potassium: 4.3 meq/L (ref 3.5–5.1)
Sodium: 140 meq/L (ref 135–145)

## 2023-10-18 LAB — VITAMIN D 25 HYDROXY (VIT D DEFICIENCY, FRACTURES): VITD: 37.93 ng/mL (ref 30.00–100.00)

## 2023-10-18 LAB — TSH: TSH: 3.3 u[IU]/mL (ref 0.35–5.50)

## 2023-10-18 NOTE — Assessment & Plan Note (Signed)
Pt's PE WNL.  UTD on pap, mammo, colonoscopy, Tdap.  Check labs.  Anticipatory guidance provided.  

## 2023-10-18 NOTE — Patient Instructions (Addendum)
 Follow up in 1 year or as needed We'll notify you of your lab results and make any changes if needed Keep up the good work on healthy diet and regular exercise- you look great!! Apply the monitor as directed when it arrives Call with any questions or concerns Stay Safe!  Stay Healthy! Happy Valentine's Day!!

## 2023-10-18 NOTE — Progress Notes (Signed)
   Subjective:    Patient ID: Catherine Camacho, female    DOB: 11-16-1975, 48 y.o.   MRN: [id number]  HPI CPE- UTD on pap, mammo, colonoscopy, Tdap  Patient Care Team    Relationship Specialty Notifications Start End  Jess Morita, MD PCP - General Family Medicine  07/17/16   Ivery Marking, MD Consulting Physician Obstetrics and Gynecology  07/17/16     Health Maintenance  Topic Date Due   INFLUENZA VACCINE  04/08/2023   MAMMOGRAM  04/30/2023   COVID-19 Vaccine (3 - 2024-25 season) 05/09/2023   DTaP/Tdap/Td (2 - Td or Tdap) 07/17/2024   Colonoscopy  03/21/2028   Hepatitis C Screening  Completed   HIV Screening  Completed   HPV VACCINES  Aged Out      Review of Systems Patient reports no vision/hearing changes, adenopathy, fever, weight change,  persistant/recurrent hoarseness , swallowing issues, chest pain, edema, persistant/recurrent cough, hemoptysis, dyspnea (rest/exertional/paroxysmal nocturnal), gastrointestinal bleeding (melena, rectal bleeding), abdominal pain, significant heartburn, bowel changes, GU symptoms (dysuria, hematuria, incontinence), Gyn symptoms (abnormal  bleeding, pain),  syncope, focal weakness, memory loss, numbness & tingling, skin/hair/nail changes, abnormal bruising or bleeding, anxiety, or depression.   + Palpitations- dad has hx of Afib.  Not currently symptomatic.  Reports this is happening multiple times a week.  Denies CP, associated SOB, diaphoresis, nausea.    Objective:   Physical Exam General Appearance:    Alert, cooperative, no distress, appears stated age  Head:    Normocephalic, without obvious abnormality, atraumatic  Eyes:    PERRL, conjunctiva/corneas clear, EOM's intact both eyes  Ears:    Normal TM's and external ear canals, both ears  Nose:   Nares normal, septum midline, mucosa normal, no drainage    or sinus tenderness  Throat:   Lips, mucosa, and tongue normal; teeth and gums normal  Neck:   Supple, symmetrical,  trachea midline, no adenopathy;    Thyroid : no enlargement/tenderness/nodules  Back:     Symmetric, no curvature, ROM normal, no CVA tenderness  Lungs:     Clear to auscultation bilaterally, respirations unlabored  Chest Wall:    No tenderness or deformity   Heart:    Regular rate and rhythm, S1 and S2 normal, no murmur, rub   or gallop  Breast Exam:    Deferred to GYN  Abdomen:     Soft, non-tender, bowel sounds active all four quadrants,    no masses, no organomegaly  Genitalia:    Deferred to GYN  Rectal:    Extremities:   Extremities normal, atraumatic, no cyanosis or edema  Pulses:   2+ and symmetric all extremities  Skin:   Skin color, texture, turgor normal, no rashes or lesions  Lymph nodes:   Cervical, supraclavicular, and axillary nodes normal  Neurologic:   CNII-XII intact, normal strength, sensation and reflexes    throughout          Assessment & Plan:  Palpitations- new.  Check labs to assess for possible underlying metabolic causes.  Asymptomatic today.  RRR.  Rather than get EKG today, will order Zio monitor for better assessment.  Pt expressed understanding and is in agreement w/ plan.

## 2023-10-18 NOTE — Progress Notes (Unsigned)
 EP to read.

## 2023-10-19 ENCOUNTER — Encounter: Payer: Self-pay | Admitting: Family Medicine

## 2023-10-19 NOTE — Telephone Encounter (Signed)
Pt concerned with elevated Lipid panel numbers

## 2023-10-25 DIAGNOSIS — R002 Palpitations: Secondary | ICD-10-CM | POA: Diagnosis not present

## 2023-10-26 ENCOUNTER — Encounter: Payer: Self-pay | Admitting: Family Medicine

## 2023-11-02 NOTE — Telephone Encounter (Signed)
 Pt is reaching out about irritation coming from heart monitor. Is there anything we can advise to help with irritation or should we redirect to cardiology?

## 2023-11-03 ENCOUNTER — Other Ambulatory Visit (HOSPITAL_COMMUNITY): Payer: Self-pay

## 2023-11-03 MED ORDER — IMIQUIMOD 5 % EX CREA
TOPICAL_CREAM | CUTANEOUS | 0 refills | Status: DC
  Filled 2023-11-03: qty 12, 14d supply, fill #0

## 2023-11-18 ENCOUNTER — Encounter: Payer: Self-pay | Admitting: Family Medicine

## 2023-12-25 ENCOUNTER — Encounter: Payer: Self-pay | Admitting: Family Medicine

## 2023-12-25 DIAGNOSIS — R002 Palpitations: Secondary | ICD-10-CM

## 2023-12-28 NOTE — Addendum Note (Signed)
 Addended by: Delcenia Inman E on: 12/28/2023 08:17 AM   Modules accepted: Orders

## 2024-04-10 ENCOUNTER — Other Ambulatory Visit (HOSPITAL_BASED_OUTPATIENT_CLINIC_OR_DEPARTMENT_OTHER): Payer: Self-pay

## 2024-04-20 LAB — HM MAMMOGRAPHY

## 2024-04-25 ENCOUNTER — Encounter: Payer: Self-pay | Admitting: Family Medicine

## 2024-05-26 NOTE — Progress Notes (Signed)
 CARDIOLOGY CONSULT NOTE       Patient ID: AAROHI REDDITT MRN: [id number] DOB/AGE: [birth year] 48 y.o.  Admit date: (Not on file) Referring Physician: Mahlon Primary Physician: Mahlon Comer BRAVO, MD Primary Cardiologist: New Reason for Consultation: Palpitations     HPI:  48 y.o. referred by Dr Mahlon for palpitations. No prior cardiac history History of ADHD and anxiety. 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 Multiple times / week Dad with history of afib.  Monitor 12/25/23 no significant arrhythmias. Average HR 84 bpm Rare PAC/PVC Symptoms associated with isolated PVC;s.  TSH and Hct normal labs earlier this year. LDL elevated at 121  He symptoms are not very problematic No pre syncope May be related to hormone replacement Rx. Discussed normal ECG, exam and likely benign nature of symptoms     ROS All other systems reviewed and negative except as noted above  Past Medical History:  Diagnosis Date   ADHD (attention deficit hyperactivity disorder)    Anxiety     Family History  Problem Relation Age of Onset   Healthy Mother    Osteoporosis Mother    Healthy Father    Hypertension Brother    Hyperlipidemia Brother    Depression Maternal Grandmother    Stroke Maternal Grandfather    Osteoporosis Paternal Grandmother    Heart disease Paternal Grandfather    Other Paternal Grandfather        suicide   Healthy Daughter    Healthy Son    Colon cancer Neg Hx    Colon polyps Neg Hx    Esophageal cancer Neg Hx    Rectal cancer Neg Hx    Stomach cancer Neg Hx     Social History   Socioeconomic History   Marital status: Married    Spouse name: Not on file   Number of children: 2   Years of education: Not on file   Highest education level: Not on file  Occupational History   Occupation: 3rd grade teacher    Employer: [employer]  Tobacco Use   Smoking  status: Never   Smokeless tobacco: Never  Vaping Use   Vaping status: Never Used  Substance and Sexual Activity   Alcohol use: Yes    Comment: social-bi weekly   Drug use: No   Sexual activity: Yes    Birth control/protection: Surgical    Comment: hysterectomy  Other Topics Concern   Not on file  Social History Narrative   Not on file   Social Drivers of Health   Financial Resource Strain: Not on file  Food Insecurity: Not on file  Transportation Needs: Not on file  Physical Activity: Not on file  Stress: Not on file  Social Connections: Not on file  Intimate Partner Violence: Not on file    Past Surgical History:  Procedure Laterality Date   TOTAL ABDOMINAL HYSTERECTOMY     partial      Current Outpatient Medications:    estradiol (CLIMARA - DOSED IN MG/24 HR) 0.025 mg/24hr patch, Place 1 patch onto the skin., Disp: , Rfl:    progesterone (PROMETRIUM) 100 MG capsule, 1 capsule at bedtime Orally Once a day; Duration: 90 days, Disp: , Rfl:    propranolol (INDERAL) 10 MG tablet, Take 1 tablet (10 mg total) by mouth 3 (three) times daily as needed (palpitations)., Disp: 90 tablet, Rfl: 1   TESTOSTERONE TD, Apply  1-2 clicks nightly as directed, Disp: , Rfl:    imiquimod  (ALDARA ) 5 % cream, Apply 1/4 packet to affected area as directed nightly for 2 weeks. (Patient not taking: Reported on 06/07/2024), Disp: 12 each, Rfl: 0    Physical Exam: Blood pressure 120/80, pulse 70, height 5' 9 (1.753 m), weight 161 lb (73 kg), SpO2 96%.    Affect appropriate Healthy:  appears stated age HEENT: normal Neck supple with no adenopathy JVP normal no bruits no thyromegaly Lungs clear with no wheezing and good diaphragmatic motion Heart:  S1/S2 no murmur, no rub, gallop or click PMI normal Abdomen: benighn, BS positve, no tenderness, no AAA no bruit.  No HSM or HJR Distal pulses intact with no bruits No edema Neuro non-focal Skin warm and dry No muscular weakness   Labs:    Lab Results  Component Value Date   WBC 6.3 10/18/2023   HGB 13.7 10/18/2023   HCT 41.2 10/18/2023   MCV 93.3 10/18/2023   PLT 264.0 10/18/2023   No results for input(s): NA, K, CL, CO2, BUN, CREATININE, CALCIUM, PROT, BILITOT, ALKPHOS, ALT, AST, GLUCOSE in the last 168 hours.  Invalid input(s): LABALBU No results found for: CKTOTAL, CKMB, CKMBINDEX, TROPONINI  Lab Results  Component Value Date   CHOL 206 (H) 10/18/2023   CHOL 214 (H) 10/14/2022   CHOL 170 10/07/2021   Lab Results  Component Value Date   HDL 68.40 10/18/2023   HDL 61.70 10/14/2022   HDL 60.70 10/07/2021   Lab Results  Component Value Date   LDLCALC 121 (H) 10/18/2023   LDLCALC 131 (H) 10/14/2022   LDLCALC 95 10/07/2021   Lab Results  Component Value Date   TRIG 79.0 10/18/2023   TRIG 105.0 10/14/2022   TRIG 72.0 10/07/2021   Lab Results  Component Value Date   CHOLHDL 3 10/18/2023   CHOLHDL 3 10/14/2022   CHOLHDL 3 10/07/2021   No results found for: LDLDIRECT    Radiology: No results found.  EKG: NSR rate 70 normal    ASSESSMENT AND PLAN:   Palpitations: benign ? Related to stress and hormonal changes/replacement. Monitor benign Given PVCls check echo to r/o structural heart dx PRN inderal offerred  ADHD stable not on meds CAD:  risk advised calcium score   Echo PRN Inderal 10 mg  Calcium score  F/U in a year   Signed: Maude Emmer 06/07/2024, 11:46 AM

## 2024-06-07 ENCOUNTER — Encounter: Payer: Self-pay | Admitting: Cardiovascular Disease

## 2024-06-07 ENCOUNTER — Other Ambulatory Visit (HOSPITAL_COMMUNITY): Payer: Self-pay

## 2024-06-07 ENCOUNTER — Ambulatory Visit: Attending: Cardiovascular Disease | Admitting: Cardiovascular Disease

## 2024-06-07 ENCOUNTER — Ambulatory Visit: Payer: Self-pay | Admitting: Cardiovascular Disease

## 2024-06-07 ENCOUNTER — Ambulatory Visit (HOSPITAL_COMMUNITY)
Admission: RE | Admit: 2024-06-07 | Discharge: 2024-06-07 | Disposition: A | Discharge status: Home / Self Care | Payer: Self-pay | Source: Ambulatory Visit | Attending: Cardiovascular Disease | Admitting: Cardiovascular Disease

## 2024-06-07 VITALS — BP 120/80 | HR 70 | Ht 69.0 in | Wt 161.0 lb

## 2024-06-07 DIAGNOSIS — R002 Palpitations: Secondary | ICD-10-CM

## 2024-06-07 MED ORDER — PROPRANOLOL HCL 10 MG PO TABS
10.0000 mg | ORAL_TABLET | Freq: Three times a day (TID) | ORAL | 1 refills | Status: AC | PRN
  Filled 2024-06-07: qty 90, 30d supply, fill #0

## 2024-06-07 NOTE — Patient Instructions (Addendum)
 Medication Instructions:  Your physician has recommended you make the following change in your medication:   NEW propranolol (Inderal) 10 mg 3 times daily as needed for palpitations  *If you need a refill on your cardiac medications before your next appointment, please call your pharmacy*  Testing/Procedures: ECHO Your physician has requested that you have an echocardiogram. Echocardiography is a painless test that uses sound waves to create images of your heart. It provides your doctor with information about the size and shape of your heart and how well your heart's chambers and valves are working. This procedure takes approximately one hour. There are no restrictions for this procedure. Please do NOT wear cologne, perfume, aftershave, or lotions (deodorant is allowed). Please arrive 15 minutes prior to your appointment time.  Please note: We ask at that you not bring children with you during ultrasound (echo/ vascular) testing. Due to room size and safety concerns, children are not allowed in the ultrasound rooms during exams. Our front office staff cannot provide observation of children in our lobby area while testing is being conducted. An adult accompanying a patient to their appointment will only be allowed in the ultrasound room at the discretion of the ultrasound technician under special circumstances. We apologize for any inconvenience.  CALCIUM SCORE Your physician has requested that you have a coronary calcium score performed. This is not covered by insurance and will be an out-of-pocket cost of approximately $99.   Follow-Up: At [facility], you and your health needs are our priority.  As part of our continuing mission to provide you with exceptional heart care, our providers are all part of one team.  This team includes your primary Cardiologist (physician) and Advanced Practice Providers or APPs (Physician Assistants and Nurse Practitioners) who all work together to provide  you with the care you need, when you need it.  Your next appointment:   As needed  Provider:   Maude Emmer, MD  We recommend signing up for the patient portal called MyChart.  Sign up information is provided on this After Visit Summary.  MyChart is used to connect with patients for Virtual Visits (Telemedicine).  Patients are able to view lab/test results, encounter notes, upcoming appointments, etc.  Non-urgent messages can be sent to your provider as well.    To learn more about what you can do with MyChart, go to [email or web address].

## 2024-06-12 ENCOUNTER — Other Ambulatory Visit: Payer: Self-pay | Admitting: Family Medicine

## 2024-06-12 DIAGNOSIS — J9859 Other diseases of mediastinum, not elsewhere classified: Secondary | ICD-10-CM

## 2024-06-12 NOTE — Progress Notes (Signed)
 Please let pt know that I ordered the chest CT recommended by radiology after reading her calcium CT scan.  There is an area seen in her chest on the calcium score that needs to be better evaluated by a separate CT w/ contrast to determine what it is.  Once we know what we are dealing with, we will know what the next steps are.

## 2024-06-13 ENCOUNTER — Encounter: Payer: Self-pay | Admitting: Radiology

## 2024-06-13 ENCOUNTER — Ambulatory Visit: Payer: Self-pay | Admitting: Family Medicine

## 2024-06-13 ENCOUNTER — Ambulatory Visit
Admission: RE | Admit: 2024-06-13 | Discharge: 2024-06-13 | Disposition: A | Discharge status: Home / Self Care | Source: Ambulatory Visit | Attending: Family Medicine | Admitting: Family Medicine

## 2024-06-13 DIAGNOSIS — J9859 Other diseases of mediastinum, not elsewhere classified: Secondary | ICD-10-CM

## 2024-06-13 MED ORDER — IOPAMIDOL (ISOVUE-300) INJECTION 61%
75.0000 mL | Freq: Once | INTRAVENOUS | Status: AC | PRN
  Administered 2024-06-13: 75 mL via INTRAVENOUS

## 2024-06-14 ENCOUNTER — Encounter: Payer: Self-pay | Admitting: Cardiovascular Disease

## 2024-06-14 ENCOUNTER — Other Ambulatory Visit

## 2024-06-21 ENCOUNTER — Telehealth: Payer: Self-pay

## 2024-06-21 NOTE — Telephone Encounter (Signed)
 Called patient to relay Dr. Charis message and left vm to return call.

## 2024-06-21 NOTE — Telephone Encounter (Signed)
 Patient called back and was informed.

## 2024-06-21 NOTE — Telephone Encounter (Signed)
 Please let pt know that I share her frustration b/c they did not perform the MRI that I ordered.  I ordered an MRI with and without contrast to best evaluate the area and they only did the without.  I'm not sure why they couldn't carry out the order as it was written.  I'm happy to have your contact in radiology review it- but I'm not allowed to send it to them b/c it was performed by a different organization.  But you should have the results available in MyChart.

## 2024-06-21 NOTE — Telephone Encounter (Signed)
 Called patient to let her know that [facility] will have to read her MRI. Left vm to return call

## 2024-06-21 NOTE — Telephone Encounter (Signed)
 Copied from CRM [id number]. Topic: Clinical - Lab/Test Results >> Jun 20, 2024  4:56 PM Sophia H wrote: Reason for CRM: Patient states she was sent to [facility] for an MRI and she has a radiologist within [facility] that she wants to have read the exam, wants to know if Dr. Mahlon would be okay with that. States she had a lot of problems when she went over for the imaging, seemed like they didn't know what they were doing, could only get it done without contrast and was overall an unpleasant experience. Please advise # [phone]

## 2024-06-21 NOTE — Telephone Encounter (Signed)
 Patient called back and I did relay the message, she states she did not get contrast b/c she was stuck more than 5 times and the veins either collapsed or were missed and so they elected not to do contrast  Patient is going to have radiology contact review the impression.

## 2024-06-23 NOTE — Telephone Encounter (Signed)
 Should patient get MRI with contrast. She is wondering what he next steps should be

## 2024-06-23 NOTE — Telephone Encounter (Signed)
 Called patient to relay message below from Dr.Tabori, Left Vm

## 2024-06-23 NOTE — Telephone Encounter (Unsigned)
 Copied from CRM #[id number]. Topic: Clinical - Lab/Test Results >> Jun 23, 2024  9:39 AM Pinkey ORN wrote: Reason for CRM: MRI Follow Up >> Jun 23, 2024  9:41 AM Pinkey ORN wrote: Patient states she's not sure of what the next steps are prior to the previous MRI results. Patient is wanting to know does she have to complete the MRI again but with contrast or does she need to follow up with a surgeon? Please follow up with patient.

## 2024-06-23 NOTE — Telephone Encounter (Signed)
 At this point we will have her see the surgeon and if they feel that they need more imaging, they can order what is needed

## 2024-06-26 NOTE — Telephone Encounter (Signed)
 Referral has been placed.

## 2024-06-26 NOTE — Telephone Encounter (Unsigned)
 Copied from CRM [id number]. Topic: Referral - Question >> Jun 23, 2024  4:37 PM Viola F wrote: Reason for CRM: Patient returned [facility] phone call and would like to proceed with the referral to a surgeon, Dr. Kerrin. Please let patient know when referral is in.

## 2024-06-27 ENCOUNTER — Other Ambulatory Visit: Payer: Self-pay

## 2024-06-27 DIAGNOSIS — J9859 Other diseases of mediastinum, not elsewhere classified: Secondary | ICD-10-CM

## 2024-07-17 ENCOUNTER — Ambulatory Visit (HOSPITAL_COMMUNITY)

## 2024-07-18 ENCOUNTER — Ambulatory Visit
Attending: Thoracic Surgery (Cardiothoracic Vascular Surgery) | Admitting: Thoracic Surgery (Cardiothoracic Vascular Surgery)

## 2024-07-18 ENCOUNTER — Encounter: Payer: Self-pay | Admitting: Thoracic Surgery (Cardiothoracic Vascular Surgery)

## 2024-07-18 ENCOUNTER — Other Ambulatory Visit: Payer: Self-pay | Admitting: Thoracic Surgery (Cardiothoracic Vascular Surgery)

## 2024-07-18 VITALS — BP 149/83 | HR 88 | Resp 20 | Ht 69.0 in | Wt 158.0 lb

## 2024-07-18 DIAGNOSIS — J9859 Other diseases of mediastinum, not elsewhere classified: Secondary | ICD-10-CM | POA: Diagnosis not present

## 2024-07-18 NOTE — Progress Notes (Signed)
 PCP is Mahlon Comer BRAVO, MD Referring Provider is Mahlon Comer BRAVO, MD  Chief Complaint  Patient presents with   Mediastinal Mass    Surgical consult    HPI: Catherine Camacho is sent for consultation regarding an anterior mediastinal mass  Catherine Camacho is a 48 year old woman with a history of ADHD and anxiety.  Has been having issues with palpitations for about 6 months.  She had a monitor in April which showed no significant arrhythmias.  She saw Dr. Delford.  She had a CT for coronary calcium scoring.  Her calcium score was 0 but she was noted to have an anterior mediastinal mass.  CT of the chest showed a 2 x 1.9 x 4.1 cm (5 cm to my measurement) mass versus cyst in the anterior mediastinum.  MR with and without contrast was recommended.  She went to [facility] to have that done but they were unable to get an IV so she did not get contrast.  It showed a T2 hyperintense and T1 isointense lesion with smooth borders.  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  She denies double vision, weakness, or trouble swallowing.  Has felt tired recently which she thinks may be due to the time change.  Past Medical History:  Diagnosis Date   ADHD (attention deficit hyperactivity disorder)    Anxiety     Past Surgical History:  Procedure Laterality Date   TOTAL ABDOMINAL HYSTERECTOMY     partial    Family History  Problem Relation Age of Onset   Healthy Mother    Osteoporosis Mother    Healthy Father    Hypertension Brother    Hyperlipidemia Brother    Depression Maternal Grandmother    Stroke Maternal Grandfather    Osteoporosis Paternal Grandmother    Heart disease Paternal Grandfather    Other Paternal Grandfather        suicide   Healthy Daughter    Healthy Son    Colon cancer Neg Hx    Colon polyps Neg Hx    Esophageal cancer Neg Hx    Rectal cancer Neg Hx    Stomach cancer Neg Hx      Social History Social History   Tobacco Use   Smoking status: Never   Smokeless tobacco: Never  Vaping Use   Vaping status: Never Used  Substance Use Topics   Alcohol use: Yes    Comment: social-bi weekly   Drug use: No    Current Outpatient Medications  Medication Sig Dispense Refill   estradiol (CLIMARA - DOSED IN MG/24 HR) 0.025 mg/24hr patch Place 1 patch onto the skin.     progesterone (PROMETRIUM) 100 MG capsule 1 capsule at bedtime Orally Once a day; Duration: 90 days     TESTOSTERONE TD Apply 1-2 clicks nightly as directed     imiquimod  (ALDARA ) 5 % cream Apply 1/4 packet to affected area as directed nightly for 2 weeks. (Patient not taking: Reported on 07/18/2024) 12 each 0   propranolol (INDERAL) 10 MG tablet Take 1 tablet (10 mg total) by mouth 3 (three) times daily as needed (palpitations). (Patient not taking: Reported on 07/18/2024) 90 tablet 1   No current facility-administered medications for this visit.    Allergies  Allergen Reactions   Lexapro [Escitalopram Oxalate] Other (See Comments)    Made her feel numb    Review of Systems  Constitutional:  Positive for fatigue. Negative for  activity change and unexpected weight change.  HENT:  Negative for trouble swallowing and voice change.   Eyes:  Negative for visual disturbance.  Respiratory:  Negative for shortness of breath.   Cardiovascular:  Positive for palpitations. Negative for chest pain and leg swelling.  Genitourinary:  Negative for difficulty urinating and dysuria.  Neurological:  Negative for dizziness and weakness.  All other systems reviewed and are negative.   BP (!) 149/83   Pulse 88   Resp 20   Ht 5' 9 (1.753 m)   Wt 158 lb (71.7 kg)   SpO2 98% Comment: RA  BMI 23.33 kg/m  Physical Exam Vitals reviewed.  Constitutional:      General: She is not in acute distress.    Appearance: Normal appearance.  HENT:     Head: Normocephalic and atraumatic.  Eyes:     General: No  scleral icterus.    Extraocular Movements: Extraocular movements intact.  Cardiovascular:     Rate and Rhythm: Normal rate and regular rhythm.     Heart sounds: Normal heart sounds. No murmur heard.    No friction rub. No gallop.  Pulmonary:     Effort: Pulmonary effort is normal. No respiratory distress.     Breath sounds: Normal breath sounds. No wheezing.  Abdominal:     General: There is no distension.     Palpations: Abdomen is soft.  Lymphadenopathy:     Cervical: No cervical adenopathy.  Skin:    General: Skin is warm and dry.  Neurological:     General: No focal deficit present.     Mental Status: She is alert and oriented to person, place, and time.     Cranial Nerves: No cranial nerve deficit.     Motor: No weakness.    Diagnostic Tests: CT CHEST WITH CONTRAST   TECHNIQUE: Multidetector CT imaging of the chest was performed during intravenous contrast administration.   RADIATION DOSE REDUCTION: This exam was performed according to the departmental dose-optimization program which includes automated exposure control, adjustment of the mA and/or kV according to patient size and/or use of iterative reconstruction technique.   CONTRAST:  75mL ISOVUE-300 IOPAMIDOL (ISOVUE-300) INJECTION 61%   COMPARISON:  Cardiac CT 06/07/2024   FINDINGS: Cardiovascular: Normal aortic caliber. Normal heart size, without pericardial effusion.   No central pulmonary embolism, on this non-dedicated study.   Mediastinum/Nodes: No supraclavicular adenopathy. No axillary or subpectoral adenopathy.   No middle mediastinal or hilar adenopathy.   Corresponding to the abnormality on calcium score CT is a rounded anterior mediastinal mass which measures 2.1 x 1.9 cm by 4.1 cm and 36 HU on image 61/2 and coronal image 95. 66 HU on the prior noncontrast exam.   Lungs/Pleura: No pleural fluid.  Clear lungs.   Upper Abdomen: Normal imaged portions of the liver, spleen,  stomach, pancreas, gallbladder, adrenal glands, kidneys.   Musculoskeletal: No acute osseous abnormality.   IMPRESSION: 1. Isolated anterior mediastinal mass, as detailed on calcium score CT. No evidence of adenopathy throughout the remainder of the chest. 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 Differential considerations include lymphoma, thymoma, thymic carcinoma. Given primary differential consideration of thymic cyst, consider further evaluation with pre and postcontrast chest MRI.   Case discussed extensively with the patient and patient's husband on 06/13/2024.     Electronically Signed   By: Rockey Kilts M.D.   On: 06/13/2024 11:25  Impression: Catherine Camacho is a 48 year old woman with  a history of ADHD, anxiety, palpitations, and a recently discovered anterior mediastinal mass versus cyst.  Anterior mediastinal lesion-differential diagnosis includes thymoma, thymic cyst, thymic carcinoma, and lymphoma.  No other adenopathy so I think lymphoma is extremely unlikely.  Most likely is a thymoma or cystic thymoma versus a simple thymic cyst.  Unfortunately her MRI was done without IV contrast so did not really showed any light on the subject.  Options include radiographic follow-up versus surgical resection.  We discussed the relative advantages and disadvantages of each approach.  If she does opt for radiographic follow-up I would strongly recommend we do complete an MRI with and without IV contrast to ensure that it is in fact a cyst.  I did describe the operative procedure that would be used in her case to Mrs. Lengacher and her husband.  I informed them of the need for general anesthesia, the incisions to be used, the use of the surgical robot, use of a drainage tube postoperatively, expected overnight stay, and overall recovery.  I informed them of the indications, risk, benefits, and alternatives.  They understand the risks  include, but not limited to MI, DVT, PE, bleeding, possible need for transfusion, possible need for conversion to open procedure, infection, recurrent nerve or phrenic nerve injury, as well as possibility of other unforeseeable complications.  She and her husband wish to discuss her options will let us  know how she would like to proceed.  In the meantime we will go ahead and schedule her for the MR  Plan: MRI with and without IV contrast to further characterize anterior mediastinal lesion. If she decides to proceed with resection she will let us  know.  Elspeth JAYSON Millers, MD [facility] [phone]

## 2024-07-21 ENCOUNTER — Encounter: Payer: Self-pay | Admitting: Family Medicine

## 2024-07-24 ENCOUNTER — Telehealth (INDEPENDENT_AMBULATORY_CARE_PROVIDER_SITE_OTHER): Admitting: Family Medicine

## 2024-07-24 ENCOUNTER — Encounter: Payer: Self-pay | Admitting: Family Medicine

## 2024-07-24 VITALS — Ht 69.0 in | Wt 158.0 lb

## 2024-07-24 DIAGNOSIS — J9859 Other diseases of mediastinum, not elsewhere classified: Secondary | ICD-10-CM | POA: Diagnosis not present

## 2024-07-24 NOTE — Progress Notes (Signed)
   Virtual Visit via Video   I connected with patient on 07/24/24 at 10:40 AM EST by a video enabled telemedicine application and verified that I am speaking with the correct person using two identifiers.  Location patient: Home Location provider: [facility], Office Persons participating in the virtual visit: Patient, Provider, CMA Rheba RAMAN)  I discussed the limitations of evaluation and management by telemedicine and the availability of in person appointments. The patient expressed understanding and agreed to proceed.  Subjective:   HPI:   Mediastinal mass- pt had routine imaging done during a cardiology workup that showed mediastinal mass.  Is asymptomatic but subsequent imaging has been inconclusive (was not able to get contrast imaging b/c of issues at the imaging center).  Met w/ surgeon who is recommending removal.  Pt is understandably hesitant.    ROS:   See pertinent positives and negatives per HPI.  Patient Active Problem List   Diagnosis Date Noted   Mediastinal mass 07/18/2024   Menopausal symptoms 03/30/2023   Vitamin D  deficiency 09/19/2018   GERD (gastroesophageal reflux disease) 05/25/2018   Physical exam 09/03/2016   Anxiety and depression 07/17/2016    Social History   Tobacco Use   Smoking status: Never   Smokeless tobacco: Never  Substance Use Topics   Alcohol use: Yes    Comment: social-bi weekly    Current Outpatient Medications:    estradiol (CLIMARA - DOSED IN MG/24 HR) 0.025 mg/24hr patch, Place 1 patch onto the skin., Disp: , Rfl:    progesterone (PROMETRIUM) 100 MG capsule, 1 capsule at bedtime Orally Once a day; Duration: 90 days, Disp: , Rfl:    TESTOSTERONE TD, Apply 1-2 clicks nightly as directed, Disp: , Rfl:    imiquimod  (ALDARA ) 5 % cream, Apply 1/4 packet to affected area as directed nightly for 2 weeks. (Patient not taking: Reported on 07/24/2024), Disp: 12 each, Rfl: 0   propranolol (INDERAL) 10 MG tablet, Take 1 tablet (10  mg total) by mouth 3 (three) times daily as needed (palpitations). (Patient not taking: Reported on 07/18/2024), Disp: 90 tablet, Rfl: 1  Allergies  Allergen Reactions   Lexapro [Escitalopram Oxalate] Other (See Comments)    Made her feel numb   Patient states no allergy.     Objective:   Ht 5' 9 (1.753 m)   Wt 158 lb (71.7 kg)   BMI 23.33 kg/m  AAOx3, NAD NCAT, EOMI No obvious CN deficits Coloring WNL Pt is able to speak clearly, coherently without shortness of breath or increased work of breathing.  Thought process is linear.  Mood is appropriate.   Assessment and Plan:  Mediastinal mass- ongoing issue for pt.  Found coincidentally during cardiology workup.  Dr Kerrin is recommending surgical removal.  Pt is understandably hesitant.  Would like a 2nd opinion.  Referral made to [facility] for 2nd opinion and to determine next steps.  Pt expressed understanding and is in agreement w/ plan.    Comer Greet, MD 07/24/2024

## 2024-08-09 ENCOUNTER — Inpatient Hospital Stay
Admission: RE | Admit: 2024-08-09 | Discharge: 2024-08-09 | Attending: Thoracic Surgery (Cardiothoracic Vascular Surgery) | Admitting: Thoracic Surgery (Cardiothoracic Vascular Surgery)

## 2024-08-09 DIAGNOSIS — J9859 Other diseases of mediastinum, not elsewhere classified: Secondary | ICD-10-CM

## 2024-08-09 MED ORDER — GADOPICLENOL 0.5 MMOL/ML IV SOLN
7.0000 mL | Freq: Once | INTRAVENOUS | Status: AC | PRN
  Administered 2024-08-09: 7 mL via INTRAVENOUS

## 2024-08-14 ENCOUNTER — Ambulatory Visit: Admitting: Family Medicine

## 2024-08-28 ENCOUNTER — Encounter: Payer: Self-pay | Admitting: Cardiovascular Disease

## 2024-08-28 ENCOUNTER — Other Ambulatory Visit: Payer: Self-pay | Admitting: *Deleted

## 2024-08-28 ENCOUNTER — Encounter: Payer: Self-pay | Admitting: *Deleted

## 2024-08-28 ENCOUNTER — Encounter: Payer: Self-pay | Admitting: Family Medicine

## 2024-08-28 DIAGNOSIS — J9859 Other diseases of mediastinum, not elsewhere classified: Secondary | ICD-10-CM

## 2024-08-29 NOTE — Telephone Encounter (Signed)
 FYI

## 2024-09-12 ENCOUNTER — Encounter (HOSPITAL_COMMUNITY): Payer: Self-pay

## 2024-09-12 NOTE — Pre-Procedure Instructions (Signed)
 Surgical Instructions   Your procedure is scheduled on September 15, 2024. Report to [facility] Main Entrance A at 9:20 A.M., then check in with the Admitting office. Any questions or running late day of surgery: call [phone]  Questions prior to your surgery date: call [phone], Monday-Friday, 8am-4pm. If you experience any cold or flu symptoms such as cough, fever, chills, shortness of breath, etc. between now and your scheduled surgery, please notify us  at the above number.     Remember:  Do not eat or drink after midnight the night before your surgery   Take these medicines the morning of surgery with A SIP OF WATER: NONE   May take these medicines IF NEEDED: propranolol  (INDERAL )    One week prior to surgery, STOP taking any Aspirin (unless otherwise instructed by your surgeon) Aleve, Naproxen, Ibuprofen, Motrin, Advil, Goody's, BC's, all herbal medications, fish oil, and non-prescription vitamins.                     Do NOT Smoke (Tobacco/Vaping) for 24 hours prior to your procedure.  If you use a CPAP at night, you may bring your mask/headgear for your overnight stay.   You will be asked to remove any contacts, glasses, piercing's, hearing aid's, dentures/partials prior to surgery. Please bring cases for these items if needed.    Patients discharged the day of surgery will not be allowed to drive home, and someone needs to stay with them for 24 hours.  SURGICAL WAITING ROOM VISITATION Patients may have no more than 2 support people in the waiting area - these visitors may rotate.   Pre-op nurse will coordinate an appropriate time for 1 ADULT support person, who may not rotate, to accompany patient in pre-op.  Children under the age of 42 must have an adult with them who is not the patient and must remain in the main waiting area with an adult.  If the patient needs to stay at the hospital during part of their recovery, the visitor guidelines for inpatient rooms  apply.  Please refer to the [facility] website for the visitor guidelines for any additional information.   If you received a COVID test during your pre-op visit  it is requested that you wear a mask when out in public, stay away from anyone that may not be feeling well and notify your surgeon if you develop symptoms. If you have been in contact with anyone that has tested positive in the last 10 days please notify you surgeon.      Pre-operative CHG Bathing Instructions   You can play a key role in reducing the risk of infection after surgery. Your skin needs to be as free of germs as possible. You can reduce the number of germs on your skin by washing with CHG (chlorhexidine gluconate) soap before surgery. CHG is an antiseptic soap that kills germs and continues to kill germs even after washing.   DO NOT use if you have an allergy to chlorhexidine/CHG or antibacterial soaps. If your skin becomes reddened or irritated, stop using the CHG and notify one of our RNs at [phone].              TAKE A SHOWER THE NIGHT BEFORE SURGERY   Please keep in mind the following:  DO NOT shave, including legs and underarms, 48 hours prior to surgery.   You may shave your face before/day of surgery.  Place clean sheets on your bed the night before  surgery Use a clean washcloth (not used since being washed) for shower. DO NOT sleep with pet's night before surgery.  CHG Shower Instructions:  Wash your face and private area with normal soap. If you choose to wash your hair, wash first with your normal shampoo.  After you use shampoo/soap, rinse your hair and body thoroughly to remove shampoo/soap residue.  Turn the water OFF and apply half the bottle of CHG soap to a CLEAN washcloth.  Apply CHG soap ONLY FROM YOUR NECK DOWN TO YOUR TOES (washing for 3-5 minutes)  DO NOT use CHG soap on face, private areas, open wounds, or sores.  Pay special attention to the area where your surgery is being  performed.  If you are having back surgery, having someone wash your back for you may be helpful. Wait 2 minutes after CHG soap is applied, then you may rinse off the CHG soap.  Pat dry with a clean towel  Put on clean pajamas    Additional instructions for the day of surgery: If you choose, you may shower the morning of surgery with an antibacterial soap.  DO NOT APPLY any lotions, deodorants, cologne, or perfumes.   Do not wear jewelry or makeup Do not wear nail polish, gel polish, artificial nails, or any other type of covering on natural nails (fingers and toes) Do not bring valuables to the hospital. [facility] is not responsible for valuables/personal belongings. Put on clean/comfortable clothes.  Please brush your teeth.  Ask your nurse before applying any prescription medications to the skin.

## 2024-09-13 ENCOUNTER — Other Ambulatory Visit: Payer: Self-pay

## 2024-09-13 ENCOUNTER — Ambulatory Visit (HOSPITAL_COMMUNITY)
Admission: RE | Admit: 2024-09-13 | Discharge: 2024-09-13 | Disposition: A | Discharge status: Home / Self Care | Source: Ambulatory Visit | Attending: Thoracic Surgery (Cardiothoracic Vascular Surgery) | Admitting: Thoracic Surgery (Cardiothoracic Vascular Surgery)

## 2024-09-13 ENCOUNTER — Other Ambulatory Visit: Payer: Self-pay | Admitting: Thoracic Surgery (Cardiothoracic Vascular Surgery)

## 2024-09-13 ENCOUNTER — Encounter (HOSPITAL_COMMUNITY): Payer: Self-pay

## 2024-09-13 VITALS — BP 151/79 | HR 94 | Temp 97.8°F | Resp 17 | Ht 69.0 in | Wt 161.0 lb

## 2024-09-13 DIAGNOSIS — J9859 Other diseases of mediastinum, not elsewhere classified: Secondary | ICD-10-CM

## 2024-09-13 DIAGNOSIS — Z01818 Encounter for other preprocedural examination: Secondary | ICD-10-CM

## 2024-09-13 DIAGNOSIS — Z01811 Encounter for preprocedural respiratory examination: Secondary | ICD-10-CM | POA: Insufficient documentation

## 2024-09-13 HISTORY — DX: Cardiac arrhythmia, unspecified: I49.9

## 2024-09-13 LAB — URINALYSIS, ROUTINE W REFLEX MICROSCOPIC
Bilirubin Urine: NEGATIVE
Glucose, UA: NEGATIVE mg/dL
Hgb urine dipstick: NEGATIVE
Ketones, ur: NEGATIVE mg/dL
Leukocytes,Ua: NEGATIVE
Nitrite: NEGATIVE
Protein, ur: NEGATIVE mg/dL
Specific Gravity, Urine: 1.004 — ABNORMAL LOW (ref 1.005–1.030)
pH: 6 (ref 5.0–8.0)

## 2024-09-13 LAB — COMPREHENSIVE METABOLIC PANEL WITH GFR
ALT: 14 U/L (ref 0–44)
AST: 18 U/L (ref 15–41)
Albumin: 4.7 g/dL (ref 3.5–5.0)
Alkaline Phosphatase: 62 U/L (ref 38–126)
Anion gap: 11 (ref 5–15)
BUN: 21 mg/dL — ABNORMAL HIGH (ref 6–20)
CO2: 25 mmol/L (ref 22–32)
Calcium: 9.4 mg/dL (ref 8.9–10.3)
Chloride: 102 mmol/L (ref 98–111)
Creatinine, Ser: 0.83 mg/dL (ref 0.44–1.00)
GFR, Estimated: >60 mL/min
Glucose, Bld: 88 mg/dL (ref 70–99)
Potassium: 3.6 mmol/L (ref 3.5–5.1)
Sodium: 138 mmol/L (ref 135–145)
Total Bilirubin: 0.3 mg/dL (ref 0.0–1.2)
Total Protein: 7.7 g/dL (ref 6.5–8.1)

## 2024-09-13 LAB — SURGICAL PCR SCREEN
MRSA, PCR: NEGATIVE
Staphylococcus aureus: NEGATIVE

## 2024-09-13 LAB — TYPE AND SCREEN
ABO/RH(D): O POS
Antibody Screen: NEGATIVE

## 2024-09-13 LAB — CBC
HCT: 43.1 % (ref 36.0–46.0)
Hemoglobin: 14.5 g/dL (ref 12.0–15.0)
MCH: 31 pg (ref 26.0–34.0)
MCHC: 33.6 g/dL (ref 30.0–36.0)
MCV: 92.3 fL (ref 80.0–100.0)
Platelets: 289 K/uL (ref 150–400)
RBC: 4.67 MIL/uL (ref 3.87–5.11)
RDW: 13.1 % (ref 11.5–15.5)
WBC: 7.3 K/uL (ref 4.0–10.5)
nRBC: 0 % (ref 0.0–0.2)

## 2024-09-13 LAB — PROTIME-INR
INR: 1 (ref 0.8–1.2)
Prothrombin Time: 13.3 s (ref 11.4–15.2)

## 2024-09-13 LAB — APTT: aPTT: 33 s (ref 24–36)

## 2024-09-13 NOTE — Progress Notes (Signed)
 PCP - Mahlon Comer BRAVO, MD  Cardiologist - Dr Delford  PPM/ICD - Denies   Chest x-ray - 09/13/2024  EKG - 09/13/2024  Stress Test -  ECHO - pt reports she had an ECHO ordered d/t her having palpitations, but this was not completed d/t finding the mass on her CT scan and needing to have surgery for this. Cardiac Cath -   Sleep Study - NA   Fasting Blood Sugar - NA   Last dose of GLP1 agonist-   NA    Blood Thinner Instructions: NA Aspirin Instructions:NA  ERAS Protcol - NPO order   COVID TEST- NA   Anesthesia review: pt reports seeing cardiology for palpitations, but was told that palpitations are likely d/t the mass that she has that is needing to be removed.   Patient denies shortness of breath, fever, cough and chest pain at PAT appointment   All instructions explained to the patient, with a verbal understanding of the material. Patient agrees to go over the instructions while at home for a better understanding. The opportunity to ask questions was provided.

## 2024-09-14 ENCOUNTER — Telehealth: Payer: Self-pay

## 2024-09-14 NOTE — Telephone Encounter (Signed)
 FMLA form completed and faxed to [phone]/ [facility]./ Beginning LOA 09/15/24 through 11/13/24./ DOS 09/15/24/ forms mailed to pt's home address

## 2024-09-15 ENCOUNTER — Other Ambulatory Visit: Payer: Self-pay

## 2024-09-15 ENCOUNTER — Encounter (HOSPITAL_COMMUNITY): Payer: Self-pay | Admitting: Thoracic Surgery (Cardiothoracic Vascular Surgery)

## 2024-09-15 ENCOUNTER — Encounter (HOSPITAL_COMMUNITY): Admission: RE | Payer: Self-pay | Source: Home / Self Care

## 2024-09-15 ENCOUNTER — Inpatient Hospital Stay (HOSPITAL_COMMUNITY)
Admission: RE | Admit: 2024-09-15 | Discharge: 2024-09-16 | DRG: 804 | Disposition: A | Attending: Thoracic Surgery (Cardiothoracic Vascular Surgery) | Admitting: Thoracic Surgery (Cardiothoracic Vascular Surgery)

## 2024-09-15 ENCOUNTER — Inpatient Hospital Stay (HOSPITAL_COMMUNITY): Admitting: Physician Assistant

## 2024-09-15 ENCOUNTER — Inpatient Hospital Stay (HOSPITAL_COMMUNITY): Admitting: Certified Registered Nurse Anesthetist

## 2024-09-15 ENCOUNTER — Inpatient Hospital Stay (HOSPITAL_COMMUNITY)

## 2024-09-15 DIAGNOSIS — J984 Other disorders of lung: Secondary | ICD-10-CM | POA: Diagnosis not present

## 2024-09-15 DIAGNOSIS — F909 Attention-deficit hyperactivity disorder, unspecified type: Secondary | ICD-10-CM | POA: Diagnosis present

## 2024-09-15 DIAGNOSIS — D72829 Elevated white blood cell count, unspecified: Secondary | ICD-10-CM | POA: Diagnosis not present

## 2024-09-15 DIAGNOSIS — J9859 Other diseases of mediastinum, not elsewhere classified: Secondary | ICD-10-CM | POA: Diagnosis present

## 2024-09-15 DIAGNOSIS — Z888 Allergy status to other drugs, medicaments and biological substances status: Secondary | ICD-10-CM

## 2024-09-15 DIAGNOSIS — Z90710 Acquired absence of both cervix and uterus: Secondary | ICD-10-CM | POA: Diagnosis not present

## 2024-09-15 DIAGNOSIS — Z818 Family history of other mental and behavioral disorders: Secondary | ICD-10-CM | POA: Diagnosis not present

## 2024-09-15 DIAGNOSIS — Z79890 Hormone replacement therapy (postmenopausal): Secondary | ICD-10-CM

## 2024-09-15 DIAGNOSIS — F419 Anxiety disorder, unspecified: Secondary | ICD-10-CM | POA: Diagnosis present

## 2024-09-15 DIAGNOSIS — Z9889 Other specified postprocedural states: Principal | ICD-10-CM

## 2024-09-15 DIAGNOSIS — E328 Other diseases of thymus: Secondary | ICD-10-CM | POA: Diagnosis present

## 2024-09-15 HISTORY — PX: EXCISION, MASS, MEDIASTINUM, ROBOT-ASSISTED: SHX7566

## 2024-09-15 SURGERY — EXCISION, MASS, MEDIASTINUM, ROBOT-ASSISTED
Anesthesia: General | Site: Chest | Laterality: Right

## 2024-09-15 MED ORDER — TRAMADOL HCL 50 MG PO TABS
50.0000 mg | ORAL_TABLET | Freq: Four times a day (QID) | ORAL | Status: DC | PRN
  Administered 2024-09-15: 50 mg via ORAL
  Administered 2024-09-16: 100 mg via ORAL
  Filled 2024-09-15: qty 1
  Filled 2024-09-15: qty 2

## 2024-09-15 MED ORDER — ACETAMINOPHEN 500 MG PO TABS
1000.0000 mg | ORAL_TABLET | Freq: Four times a day (QID) | ORAL | Status: DC
  Administered 2024-09-15 – 2024-09-16 (×4): 1000 mg via ORAL
  Filled 2024-09-15 (×3): qty 2

## 2024-09-15 MED ORDER — CEFAZOLIN SODIUM-DEXTROSE 2-4 GM/100ML-% IV SOLN
2.0000 g | Freq: Three times a day (TID) | INTRAVENOUS | Status: AC
  Administered 2024-09-15 – 2024-09-16 (×2): 2 g via INTRAVENOUS
  Filled 2024-09-15 (×2): qty 100

## 2024-09-15 MED ORDER — LIDOCAINE 2% (20 MG/ML) 5 ML SYRINGE
INTRAMUSCULAR | Status: DC | PRN
  Administered 2024-09-15: 50 mg via INTRAVENOUS

## 2024-09-15 MED ORDER — SODIUM CHLORIDE 0.9 % IV SOLN
INTRAVENOUS | Status: AC | PRN
  Administered 2024-09-15: 1000 mL via INTRAMUSCULAR

## 2024-09-15 MED ORDER — ROCURONIUM BROMIDE 10 MG/ML (PF) SYRINGE
PREFILLED_SYRINGE | INTRAVENOUS | Status: AC
  Filled 2024-09-15: qty 30

## 2024-09-15 MED ORDER — HYDROMORPHONE HCL 1 MG/ML IJ SOLN
INTRAMUSCULAR | Status: AC
  Filled 2024-09-15: qty 0.5

## 2024-09-15 MED ORDER — OXYCODONE HCL 5 MG PO TABS: 5.0000 mg | ORAL_TABLET | Freq: Once | ORAL | Status: DC | PRN

## 2024-09-15 MED ORDER — DEXAMETHASONE SOD PHOSPHATE PF 10 MG/ML IJ SOLN
INTRAMUSCULAR | Status: DC | PRN
  Administered 2024-09-15: 10 mg via INTRAVENOUS

## 2024-09-15 MED ORDER — 0.9 % SODIUM CHLORIDE (POUR BTL) OPTIME
TOPICAL | Status: DC | PRN
  Administered 2024-09-15: 1000 mL

## 2024-09-15 MED ORDER — KETOROLAC TROMETHAMINE 15 MG/ML IJ SOLN
15.0000 mg | Freq: Four times a day (QID) | INTRAMUSCULAR | Status: DC
  Administered 2024-09-15 – 2024-09-16 (×3): 15 mg via INTRAVENOUS
  Filled 2024-09-15 (×3): qty 1

## 2024-09-15 MED ORDER — PHENYLEPHRINE HCL-NACL 20-0.9 MG/250ML-% IV SOLN
INTRAVENOUS | Status: DC | PRN
  Administered 2024-09-15: 10 ug/min via INTRAVENOUS

## 2024-09-15 MED ORDER — ONDANSETRON HCL 4 MG/2ML IJ SOLN
INTRAMUSCULAR | Status: DC | PRN
  Administered 2024-09-15 (×2): 4 mg via INTRAVENOUS

## 2024-09-15 MED ORDER — HEMOSTATIC AGENTS (NO CHARGE) OPTIME
TOPICAL | Status: DC | PRN
  Administered 2024-09-15: 2 via TOPICAL

## 2024-09-15 MED ORDER — CEFAZOLIN SODIUM-DEXTROSE 2-4 GM/100ML-% IV SOLN
2.0000 g | INTRAVENOUS | Status: AC
  Administered 2024-09-15: 2 g via INTRAVENOUS

## 2024-09-15 MED ORDER — CHLORHEXIDINE GLUCONATE 0.12 % MT SOLN
OROMUCOSAL | Status: AC
  Administered 2024-09-15: 15 mL via OROMUCOSAL
  Filled 2024-09-15: qty 15

## 2024-09-15 MED ORDER — CHLORHEXIDINE GLUCONATE 0.12 % MT SOLN: 15.0000 mL | Freq: Once | OROMUCOSAL | Status: AC

## 2024-09-15 MED ORDER — GABAPENTIN 300 MG PO CAPS
300.0000 mg | ORAL_CAPSULE | Freq: Every day | ORAL | Status: DC
  Administered 2024-09-15: 300 mg via ORAL
  Filled 2024-09-15: qty 1

## 2024-09-15 MED ORDER — FENTANYL CITRATE (PF) 100 MCG/2ML IJ SOLN: 25.0000 ug | INTRAMUSCULAR | Status: DC | PRN

## 2024-09-15 MED ORDER — DEXAMETHASONE SOD PHOSPHATE PF 10 MG/ML IJ SOLN
INTRAMUSCULAR | Status: AC
  Filled 2024-09-15: qty 2

## 2024-09-15 MED ORDER — CEFAZOLIN SODIUM-DEXTROSE 2-4 GM/100ML-% IV SOLN
INTRAVENOUS | Status: AC
  Filled 2024-09-15: qty 100

## 2024-09-15 MED ORDER — POTASSIUM CHLORIDE IN NACL 20-0.9 MEQ/L-% IV SOLN
INTRAVENOUS | Status: DC
  Filled 2024-09-15 (×3): qty 1000

## 2024-09-15 MED ORDER — PROPOFOL 500 MG/50ML IV EMUL
INTRAVENOUS | Status: DC | PRN
  Administered 2024-09-15: 50 ug/kg/min via INTRAVENOUS

## 2024-09-15 MED ORDER — HYDROMORPHONE HCL 1 MG/ML IJ SOLN
INTRAMUSCULAR | Status: DC | PRN
  Administered 2024-09-15: .5 mg via INTRAVENOUS
  Administered 2024-09-15: 1 mg via INTRAVENOUS

## 2024-09-15 MED ORDER — FENTANYL CITRATE (PF) 250 MCG/5ML IJ SOLN
INTRAMUSCULAR | Status: AC
  Filled 2024-09-15: qty 5

## 2024-09-15 MED ORDER — BUPIVACAINE HCL (PF) 0.5 % IJ SOLN
INTRAMUSCULAR | Status: AC
  Filled 2024-09-15: qty 30

## 2024-09-15 MED ORDER — FENTANYL CITRATE (PF) 50 MCG/ML IJ SOSY: 25.0000 ug | PREFILLED_SYRINGE | INTRAMUSCULAR | Status: DC | PRN

## 2024-09-15 MED ORDER — ONDANSETRON HCL 4 MG/2ML IJ SOLN
INTRAMUSCULAR | Status: AC
  Filled 2024-09-15: qty 4

## 2024-09-15 MED ORDER — ACETAMINOPHEN 160 MG/5ML PO SOLN: 1000.0000 mg | Freq: Four times a day (QID) | ORAL | Status: DC

## 2024-09-15 MED ORDER — ORAL CARE MOUTH RINSE: 15.0000 mL | Freq: Once | OROMUCOSAL | Status: AC

## 2024-09-15 MED ORDER — LIDOCAINE 2% (20 MG/ML) 5 ML SYRINGE
INTRAMUSCULAR | Status: AC
  Filled 2024-09-15: qty 10

## 2024-09-15 MED ORDER — FENTANYL CITRATE (PF) 100 MCG/2ML IJ SOLN
INTRAMUSCULAR | Status: AC
  Filled 2024-09-15: qty 2

## 2024-09-15 MED ORDER — LACTATED RINGERS IV SOLN: INTRAVENOUS | Status: DC | PRN

## 2024-09-15 MED ORDER — MIDAZOLAM HCL (PF) 2 MG/2ML IJ SOLN
INTRAMUSCULAR | Status: DC | PRN
  Administered 2024-09-15: 2 mg via INTRAVENOUS

## 2024-09-15 MED ORDER — OXYCODONE HCL 5 MG/5ML PO SOLN: 5.0000 mg | Freq: Once | ORAL | Status: DC | PRN

## 2024-09-15 MED ORDER — SUGAMMADEX SODIUM 200 MG/2ML IV SOLN
INTRAVENOUS | Status: DC | PRN
  Administered 2024-09-15: 145.2 mg via INTRAVENOUS

## 2024-09-15 MED ORDER — FENTANYL CITRATE (PF) 250 MCG/5ML IJ SOLN
INTRAMUSCULAR | Status: DC | PRN
  Administered 2024-09-15 (×3): 50 ug via INTRAVENOUS
  Administered 2024-09-15: 100 ug via INTRAVENOUS

## 2024-09-15 MED ORDER — BUPIVACAINE LIPOSOME 1.3 % IJ SUSP
INTRAMUSCULAR | Status: AC
  Filled 2024-09-15: qty 20

## 2024-09-15 MED ORDER — ACETAMINOPHEN 10 MG/ML IV SOLN: 1000.0000 mg | Freq: Once | INTRAVENOUS | Status: DC | PRN

## 2024-09-15 MED ORDER — MIDAZOLAM HCL 2 MG/2ML IJ SOLN
INTRAMUSCULAR | Status: AC
  Filled 2024-09-15: qty 2

## 2024-09-15 MED ORDER — ROCURONIUM BROMIDE 10 MG/ML (PF) SYRINGE
PREFILLED_SYRINGE | INTRAVENOUS | Status: DC | PRN
  Administered 2024-09-15: 30 mg via INTRAVENOUS
  Administered 2024-09-15: 70 mg via INTRAVENOUS
  Administered 2024-09-15: 30 mg via INTRAVENOUS

## 2024-09-15 MED ORDER — ENOXAPARIN SODIUM 40 MG/0.4ML IJ SOSY: 40.0000 mg | PREFILLED_SYRINGE | INTRAMUSCULAR | Status: DC

## 2024-09-15 MED ORDER — SENNOSIDES-DOCUSATE SODIUM 8.6-50 MG PO TABS
1.0000 | ORAL_TABLET | Freq: Every day | ORAL | Status: DC
  Administered 2024-09-15: 1 via ORAL
  Filled 2024-09-15: qty 1

## 2024-09-15 MED ORDER — PANTOPRAZOLE SODIUM 40 MG PO TBEC
40.0000 mg | DELAYED_RELEASE_TABLET | Freq: Every day | ORAL | Status: DC
  Administered 2024-09-16: 40 mg via ORAL
  Filled 2024-09-15: qty 1

## 2024-09-15 MED ORDER — KETOROLAC TROMETHAMINE 30 MG/ML IJ SOLN
INTRAMUSCULAR | Status: DC | PRN
  Administered 2024-09-15: 30 mg via INTRAVENOUS

## 2024-09-15 MED ORDER — PROGESTERONE 200 MG PO CAPS
200.0000 mg | ORAL_CAPSULE | Freq: Every day | ORAL | Status: DC
  Administered 2024-09-15: 200 mg via ORAL
  Filled 2024-09-15 (×2): qty 1

## 2024-09-15 MED ORDER — FENTANYL CITRATE (PF) 100 MCG/2ML IJ SOLN
25.0000 ug | INTRAMUSCULAR | Status: DC | PRN
  Administered 2024-09-15: 50 ug via INTRAVENOUS

## 2024-09-15 MED ORDER — OXYCODONE HCL 5 MG PO TABS
5.0000 mg | ORAL_TABLET | ORAL | Status: DC | PRN
  Administered 2024-09-15: 5 mg via ORAL
  Administered 2024-09-15 – 2024-09-16 (×3): 10 mg via ORAL
  Filled 2024-09-15: qty 2
  Filled 2024-09-15: qty 1
  Filled 2024-09-15 (×2): qty 2

## 2024-09-15 MED ORDER — BISACODYL 5 MG PO TBEC
10.0000 mg | DELAYED_RELEASE_TABLET | Freq: Every day | ORAL | Status: DC
  Filled 2024-09-15: qty 2

## 2024-09-15 MED ORDER — DIPHENHYDRAMINE HCL 25 MG PO CAPS: 25.0000 mg | ORAL_CAPSULE | Freq: Four times a day (QID) | ORAL | Status: DC | PRN

## 2024-09-15 MED ORDER — GABAPENTIN 300 MG PO CAPS: 300.0000 mg | ORAL_CAPSULE | Freq: Two times a day (BID) | ORAL | Status: DC

## 2024-09-15 MED ORDER — LACTATED RINGERS IV SOLN: INTRAVENOUS | Status: DC

## 2024-09-15 MED ORDER — PROPRANOLOL HCL 10 MG PO TABS: 10.0000 mg | ORAL_TABLET | Freq: Three times a day (TID) | ORAL | Status: DC | PRN

## 2024-09-15 MED ORDER — ONDANSETRON HCL 4 MG/2ML IJ SOLN: 4.0000 mg | Freq: Four times a day (QID) | INTRAMUSCULAR | Status: DC | PRN

## 2024-09-15 MED ORDER — HYDROMORPHONE HCL 1 MG/ML IJ SOLN
INTRAMUSCULAR | Status: AC
  Filled 2024-09-15: qty 1

## 2024-09-15 MED ORDER — PROPOFOL 10 MG/ML IV BOLUS
INTRAVENOUS | Status: DC | PRN
  Administered 2024-09-15: 40 mg via INTRAVENOUS
  Administered 2024-09-15: 160 mg via INTRAVENOUS

## 2024-09-15 MED ORDER — BUPIVACAINE LIPOSOME 1.3 % IJ SUSP
INTRAMUSCULAR | Status: DC | PRN
  Administered 2024-09-15: 50 mL

## 2024-09-15 SURGICAL SUPPLY — 59 items
BLADE STERNUM SYSTEM 6 (BLADE) IMPLANT
CANNULA REDUCER 12-8 DVNC XI (CANNULA) IMPLANT
CNTNR URN SCR LID CUP LEK RST (MISCELLANEOUS) ×4 IMPLANT
DEFOGGER SCOPE WARM SEASHARP (MISCELLANEOUS) ×1 IMPLANT
DERMABOND ADVANCED .7 DNX12 (GAUZE/BANDAGES/DRESSINGS) IMPLANT
DRAIN CHANNEL 28F RND 3/8 FF (WOUND CARE) ×1 IMPLANT
DRAIN CONNECTOR BLAKE 1:1 (MISCELLANEOUS) IMPLANT
DRAPE ARM DVNC X/XI (DISPOSABLE) ×4 IMPLANT
DRAPE COLUMN DVNC XI (DISPOSABLE) ×1 IMPLANT
DRAPE CV SPLIT W-CLR ANES SCRN (DRAPES) ×1 IMPLANT
DRAPE HALF SHEET 40X57 (DRAPES) IMPLANT
DRAPE SURG ORHT 6 SPLT 77X108 (DRAPES) ×1 IMPLANT
DRIVER NDLE MEGA SUTCUT DVNCXI (INSTRUMENTS) IMPLANT
ELECTRODE REM PT RTRN 9FT ADLT (ELECTROSURGICAL) ×1 IMPLANT
FELT TEFLON 1X6 (MISCELLANEOUS) IMPLANT
FORCEPS BPLR FENES DVNC XI (FORCEP) IMPLANT
FORCEPS BPLR LNG DVNC XI (INSTRUMENTS) IMPLANT
GAUZE KITTNER 4X8 (MISCELLANEOUS) ×2 IMPLANT
GAUZE SPONGE 4X4 12PLY STRL (GAUZE/BANDAGES/DRESSINGS) IMPLANT
GLOVE SS BIOGEL STRL SZ 7.5 (GLOVE) ×2 IMPLANT
GLOVE SURG SS PI 8.0 STRL IVOR (GLOVE) ×1 IMPLANT
GOWN STRL REUS W/ TWL LRG LVL3 (GOWN DISPOSABLE) ×1 IMPLANT
GOWN STRL REUS W/ TWL XL LVL3 (GOWN DISPOSABLE) ×2 IMPLANT
GOWN STRL REUS W/TWL 2XL LVL3 (GOWN DISPOSABLE) ×1 IMPLANT
GRASPER TIP-UP FEN DVNC XI (INSTRUMENTS) IMPLANT
HEMOSTAT SURGICEL 2X14 (HEMOSTASIS) ×2 IMPLANT
IRRIGATION STRYKERFLOW (MISCELLANEOUS) ×1 IMPLANT
IV SODIUM CHL 0.9% 500ML (IV SOLUTION) ×1 IMPLANT
KIT SUCTION CATH 14FR (SUCTIONS) IMPLANT
NEEDLE 25GX 5/8IN NON SAFETY (NEEDLE) ×1 IMPLANT
NEEDLE HYPO 25GX1X1/2 BEV (NEEDLE) ×1 IMPLANT
NEEDLE SPNL 22GX3.5 QUINCKE BK (NEEDLE) ×1 IMPLANT
PACK CHEST (CUSTOM PROCEDURE TRAY) ×1 IMPLANT
PAD ARMBOARD POSITIONER FOAM (MISCELLANEOUS) ×2 IMPLANT
PAD ELECT DEFIB RADIOL ZOLL (MISCELLANEOUS) IMPLANT
PORT ACCESS TROCAR AIRSEAL 12 (TROCAR) ×1 IMPLANT
SEAL UNIV 5-12 XI (MISCELLANEOUS) ×3 IMPLANT
SEALER SYNCHRO 8 IS4000 DVNC (MISCELLANEOUS) IMPLANT
SET TRI-LUMEN FLTR TB AIRSEAL (TUBING) ×1 IMPLANT
SOLUTION ELECTROSURG ANTI STCK (MISCELLANEOUS) ×1 IMPLANT
SPONGE TONSIL 1 RF SGL (DISPOSABLE) ×1 IMPLANT
SUT PROLENE 4 0 SH DA (SUTURE) IMPLANT
SUT PROLENE 4-0 RB1 .5 CRCL 36 (SUTURE) IMPLANT
SUT SILK 1 MH (SUTURE) ×1 IMPLANT
SUT SILK 2 0 SH CR/8 (SUTURE) IMPLANT
SUT STEEL 6MS V (SUTURE) IMPLANT
SUT STEEL SZ 6 DBL 3X14 BALL (SUTURE) IMPLANT
SUT VIC AB 1 CTX36XBRD ANBCTR (SUTURE) ×1 IMPLANT
SUT VIC AB 2-0 CTX 36 (SUTURE) ×1 IMPLANT
SUT VIC AB 3-0 X1 27 (SUTURE) ×2 IMPLANT
SUT VICRYL 0 TIES 12 18 (SUTURE) ×1 IMPLANT
SUT VICRYL 0 UR6 27IN ABS (SUTURE) ×2 IMPLANT
SYR 20CC LL (SYRINGE) ×2 IMPLANT
SYSTEM RETRIEVAL ANCHOR 8 (MISCELLANEOUS) IMPLANT
SYSTEM SAHARA CHEST DRAIN ATS (WOUND CARE) IMPLANT
TAPE CLOTH SURG 4X10 WHT LF (GAUZE/BANDAGES/DRESSINGS) IMPLANT
TOWEL GREEN STERILE (TOWEL DISPOSABLE) IMPLANT
TOWEL GREEN STERILE FF (TOWEL DISPOSABLE) IMPLANT
TRAY FOLEY SLVR 16FR TEMP STAT (SET/KITS/TRAYS/PACK) ×1 IMPLANT

## 2024-09-15 NOTE — Discharge Summary (Incomplete)
 "      [address] [place] [address]             [phone]        Physician Discharge Summary  Patient ID: Catherine Camacho MRN: [id number] DOB/AGE: 51-Nov-1977 51 y.o.  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  Has been having issues with palpitations for about 6 months.  She had a monitor in April which showed no significant arrhythmias.  She saw Dr. Delford.  She had a CT for coronary calcium scoring.  Her calcium score was 0 but she was noted to have an anterior mediastinal mass.   CT of the chest showed a 2 x 1.9 x 4.1 cm (5 cm to my measurement) mass versus cyst in the anterior mediastinum.  MR with and without contrast was recommended.  She went to [facility] to have that done but they were unable to get an IV so she did not get contrast.  It showed a T2 hyperintense and T1 isointense lesion with smooth borders.  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   She presented for evaluation with Dr. Kerrin at which time she  denied double vision, weakness, or trouble swallowing.  Has felt tired recently which she thinks may be due to the time change.  He recommended MRI with IV contrast to further characterize the lesion.  He also recommended surgical resection of the mass.  The risks and benefits of the procedure were explained to the patient and he was agreeable to proceed.  Hospital Course:  Catherine Camacho presented to [facility] on 09/15/2024.  She was taken to the operating room and underwent Xi assisted right thoracoscopy for excision of mediastinal mass. She was extubated and transported from the OR to PACU in stable condition.  Consults: {consultation:18241}  Significant Diagnostic Studies: {diagnostics:18242}   Treatments: surgery: ***   Discharge Exam: Blood pressure (!) 148/86, pulse 99, temperature 97.7 F (36.5 C), temperature source Axillary, resp. rate 20, height 5' 9 (1.753 m), weight 72.6 kg, SpO2 99%. {physical [id number]   Discharge Medications:   Allergies as of 09/15/2024       Reactions   Lexapro [escitalopram Oxalate] Other (See Comments)   Made her feel numb Patient states no allergy.      Med Rec must be completed prior to using this [facility]***       Follow-up Information     Kerrin Elspeth BROCKS, MD. Go on 10/04/2024.   Specialty: Cardiothoracic Surgery Why: Please arrive by 2:45 pm in order to have PA/LAT CXR taken PRIOR to office appointment.  CXR located in the same building on the SECOND floor. Appointment time is at 3:45 pm Contact information: [address] [place] [address] [phone]                 Signed:  Donielle M Zimmerman, PA-C 09/15/2024, 4:12 PM  "

## 2024-09-15 NOTE — H&P (Signed)
 " PCP is Mahlon Comer BRAVO, MD Referring Provider is Mahlon Comer BRAVO, MD       Chief Complaint  Patient presents with   Mediastinal Mass      Surgical consult      HPI: Mrs. Brotzman is sent for consultation regarding an anterior mediastinal mass   Nariya Neumeyer is a 49 year old woman with a history of ADHD and anxiety.  Has been having issues with palpitations for about 6 months.  She had a monitor in April which showed no significant arrhythmias.  She saw Dr. Delford.  She had a CT for coronary calcium scoring.  Her calcium score was 0 but she was noted to have an anterior mediastinal mass.   CT of the chest showed a 2 x 1.9 x 4.1 cm (5 cm to my measurement) mass versus cyst in the anterior mediastinum.  MR with and without contrast was recommended.  She went to [facility] to have that done but they were unable to get an IV so she did not get contrast.  It showed a T2 hyperintense and T1 isointense lesion with smooth borders.  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   She denies double vision, weakness, or trouble swallowing.  Has felt tired recently which she thinks may be due to the time change.       Past Medical History:  Diagnosis Date   ADHD (attention deficit hyperactivity disorder)     Anxiety                 Past Surgical History:  Procedure Laterality Date   TOTAL ABDOMINAL HYSTERECTOMY        partial               Family History  Problem Relation Age of Onset   Healthy Mother     Osteoporosis Mother     Healthy Father     Hypertension Brother     Hyperlipidemia Brother     Depression Maternal Grandmother     Stroke Maternal Grandfather     Osteoporosis Paternal Grandmother     Heart disease Paternal Grandfather     Other Paternal Grandfather          suicide   Healthy Daughter     Healthy Son     Colon cancer Neg Hx     Colon polyps Neg Hx     Esophageal  cancer Neg Hx     Rectal cancer Neg Hx     Stomach cancer Neg Hx            Social History Social History  Social History         Tobacco Use   Smoking status: Never   Smokeless tobacco: Never  Vaping Use   Vaping status: Never Used  Substance Use Topics   Alcohol use: Yes      Comment: social-bi weekly   Drug use: No              Current Outpatient Medications  Medication Sig Dispense Refill   estradiol (CLIMARA - DOSED IN MG/24 HR) 0.025 mg/24hr patch Place 1 patch onto the skin.       progesterone  (PROMETRIUM ) 100 MG capsule 1 capsule at bedtime Orally Once a day; Duration: 90 days       TESTOSTERONE TD Apply 1-2 clicks nightly as directed       imiquimod  (ALDARA ) 5 % cream Apply  1/4 packet to affected area as directed nightly for 2 weeks. (Patient not taking: Reported on 07/18/2024) 12 each 0   propranolol  (INDERAL ) 10 MG tablet Take 1 tablet (10 mg total) by mouth 3 (three) times daily as needed (palpitations). (Patient not taking: Reported on 07/18/2024) 90 tablet 1      No current facility-administered medications for this visit.        Allergies       Allergies  Allergen Reactions   Lexapro [Escitalopram Oxalate] Other (See Comments)      Made her feel numb        Review of Systems  Constitutional:  Positive for fatigue. Negative for activity change and unexpected weight change.  HENT:  Negative for trouble swallowing and voice change.   Eyes:  Negative for visual disturbance.  Respiratory:  Negative for shortness of breath.   Cardiovascular:  Positive for palpitations. Negative for chest pain and leg swelling.  Genitourinary:  Negative for difficulty urinating and dysuria.  Neurological:  Negative for dizziness and weakness.  All other systems reviewed and are negative.    BP (!) 149/83   Pulse 88   Resp 20   Ht 5' 9 (1.753 m)   Wt 158 lb (71.7 kg)   SpO2 98% Comment: RA  BMI 23.33 kg/m  Physical Exam Vitals reviewed.  Constitutional:       General: She is not in acute distress.    Appearance: Normal appearance.  HENT:     Head: Normocephalic and atraumatic.  Eyes:     General: No scleral icterus.    Extraocular Movements: Extraocular movements intact.  Cardiovascular:     Rate and Rhythm: Normal rate and regular rhythm.     Heart sounds: Normal heart sounds. No murmur heard.    No friction rub. No gallop.  Pulmonary:     Effort: Pulmonary effort is normal. No respiratory distress.     Breath sounds: Normal breath sounds. No wheezing.  Abdominal:     General: There is no distension.     Palpations: Abdomen is soft.  Lymphadenopathy:     Cervical: No cervical adenopathy.  Skin:    General: Skin is warm and dry.  Neurological:     General: No focal deficit present.     Mental Status: She is alert and oriented to person, place, and time.     Cranial Nerves: No cranial nerve deficit.     Motor: No weakness.     Diagnostic Tests: CT CHEST WITH CONTRAST   TECHNIQUE: Multidetector CT imaging of the chest was performed during intravenous contrast administration.   RADIATION DOSE REDUCTION: This exam was performed according to the departmental dose-optimization program which includes automated exposure control, adjustment of the mA and/or kV according to patient size and/or use of iterative reconstruction technique.   CONTRAST:  75mL ISOVUE -300 IOPAMIDOL  (ISOVUE -300) INJECTION 61%   COMPARISON:  Cardiac CT 06/07/2024   FINDINGS: Cardiovascular: Normal aortic caliber. Normal heart size, without pericardial effusion.   No central pulmonary embolism, on this non-dedicated study.   Mediastinum/Nodes: No supraclavicular adenopathy. No axillary or subpectoral adenopathy.   No middle mediastinal or hilar adenopathy.   Corresponding to the abnormality on calcium score CT is a rounded anterior mediastinal mass which measures 2.1 x 1.9 cm by 4.1 cm and 36 HU on image 61/2 and coronal image 95. 66 HU on the  prior noncontrast exam.   Lungs/Pleura: No pleural fluid.  Clear lungs.   Upper Abdomen: Normal imaged  portions of the liver, spleen, stomach, pancreas, gallbladder, adrenal glands, kidneys.   Musculoskeletal: No acute osseous abnormality.   IMPRESSION: 1. Isolated anterior mediastinal mass, as detailed on calcium score CT. No evidence of adenopathy throughout the remainder of the chest. 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 Differential considerations include lymphoma, thymoma, thymic carcinoma. Given primary differential consideration of thymic cyst, consider further evaluation with pre and postcontrast chest MRI.   Case discussed extensively with the patient and patient's husband on 06/13/2024.     Electronically Signed   By: Rockey Kilts M.D.   On: 06/13/2024 11:25   Impression: Cailie Bosshart is a 49 year old woman with a history of ADHD, anxiety, palpitations, and a recently discovered anterior mediastinal mass versus cyst.   Anterior mediastinal lesion-differential diagnosis includes thymoma, thymic cyst, thymic carcinoma, and lymphoma.  No other adenopathy so I think lymphoma is extremely unlikely.  Most likely is a thymoma or cystic thymoma versus a simple thymic cyst.  Unfortunately her MRI was done without IV contrast so did not really showed any light on the subject.   Options include radiographic follow-up versus surgical resection.  We discussed the relative advantages and disadvantages of each approach.  If she does opt for radiographic follow-up I would strongly recommend we do complete an MRI with and without IV contrast to ensure that it is in fact a cyst.   I did describe the operative procedure that would be used in her case to Mrs. Kahler and her husband.  I informed them of the need for general anesthesia, the incisions to be used, the use of the surgical robot, use of a drainage tube postoperatively,  expected overnight stay, and overall recovery.  I informed them of the indications, risk, benefits, and alternatives.  They understand the risks include, but not limited to MI, DVT, PE, bleeding, possible need for transfusion, possible need for conversion to open procedure, infection, recurrent nerve or phrenic nerve injury, as well as possibility of other unforeseeable complications.   She and her husband wish to discuss her options will let us  know how she would like to proceed.  In the meantime we will go ahead and schedule her for the MR   Plan: MRI with and without IV contrast to further characterize anterior mediastinal lesion. If she decides to proceed with resection she will let us  know.   Elspeth JAYSON Millers, MD [facility] [phone]   No interval change  For resection anterior mediastinal mass  Elspeth JAYSON. Millers, MD [facility] [phone]  "

## 2024-09-15 NOTE — Transfer of Care (Signed)
 Immediate Anesthesia Transfer of Care Note  Patient: Catherine Camacho  Procedure(s) Performed: EXCISION, MASS THYMECTOMY, MEDIASTINUM, ROBOT-ASSISTED (Right: Chest)  Patient Location: PACU  Anesthesia Type:General  Level of Consciousness: awake, alert , and oriented  Airway & Oxygen Therapy: Patient Spontanous Breathing  Post-op Assessment: Report given to RN and Post -op Vital signs reviewed and stable  Post vital signs: Reviewed and stable  Last Vitals:  Vitals Value Taken Time  BP 128/72 09/15/24 16:15  Temp 36.8 C 09/15/24 16:13  Pulse 89 09/15/24 16:16  Resp 13 09/15/24 16:16  SpO2 98 % 09/15/24 16:16  Vitals shown include unfiled device data.  Last Pain:  Vitals:   09/15/24 1051  TempSrc:   PainSc: 0-No pain         Complications: No notable events documented.

## 2024-09-15 NOTE — Hospital Course (Addendum)
 History of Present Illness:  Catherine Camacho is a 49 year old woman with a history of ADHD and anxiety.  Has been having issues with palpitations for about 6 months.  She had a monitor in April which showed no significant arrhythmias.  She saw Dr. Delford.  She had a CT for coronary calcium scoring.  Her calcium score was 0 but she was noted to have an anterior mediastinal mass.   CT of the chest showed a 2 x 1.9 x 4.1 cm (5 cm to my measurement) mass versus cyst in the anterior mediastinum.  MR with and without contrast was recommended.  She went to [facility] to have that done but they were unable to get an IV so she did not get contrast.  It showed a T2 hyperintense and T1 isointense lesion with smooth borders.  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   She presented for evaluation with Dr. Kerrin at which time she denied double vision, weakness, or trouble swallowing.  Has felt tired recently which she thinks may be due to the time change.  He recommended MRI with IV contrast to further characterize the lesion.  He also recommended surgical resection of the mass.  The risks and benefits of the procedure were explained to the patient and he was agreeable to proceed.  Hospital Course:  [name] presented to [facility] on 09/15/2024.  She was taken to the operating room and underwent Xi assisted right thoracoscopy for excision of mediastinal mass. She was extubated and transported from the OR to PACU in stable condition.

## 2024-09-15 NOTE — Op Note (Unsigned)
 NAME: Davern, Teyonna MEDICAL RECORD NO: [id number] ACCOUNT NO: [id number] DATE OF BIRTH: 11-14-1975 FACILITY: [facility] LOCATION: [facility] PHYSICIAN: Elspeth BROCKS. Kerrin, MD  Operative Report   DATE OF PROCEDURE: 09/15/2024  PREOPERATIVE DIAGNOSIS:  Mediastinal cyst versus mass.  POSTOPERATIVE DIAGNOSIS:  Mediastinal cyst.  PROCEDURE:  Xi robotic assisted right thoracoscopy, thymectomy.  SURGEON:  Elspeth BROCKS. Kerrin, MD  ASSISTANT:  Maryelizabeth Purpura.  Experience assistance was necessary for this case due to surgical complexity.  Dr. Maryelizabeth Purpura assisted with port placement, robot docking and undocking, instrument exchange, specimen retrieval, suctioning, and wound closure.  I performed the robotic  dissection.  ANESTHESIA:  General.  FINDINGS:  Smooth but thick walled cystic mass completely encapsulated within the thymus.  No invasion of surrounding structures.  CLINICAL NOTE:  The patient is a 49 year old woman who had a CT for coronary calcium scoring which showed an anterior mediastinal mass versus cyst.  She had a workup including MRI but were unable to more definitively characterize the cyst.  She was  offered surgical resection versus radiographic followup after discussion and obtaining a second opinion, she decided to proceed with surgical resection.  The indications risks benefits and alternatives were discussed in detail with the patient.  She  understood and accepted the risks and agreed to proceed.  OPERATIVE NOTE:  The patient was brought to the operating room on 09/15/2024.  She had induction of general anesthesia and was intubated with a double-lumen endotracheal tube.  Intravenous antibiotics were administered.  A Foley catheter was placed.   Sequential compression devices were placed on the calves for DVT prophylaxis.  She was left in a supine position, but the right chest was elevated.  A Bair Hugger was placed for active warming.  The chest and upper abdomen were prepped  and draped in the  usual sterile fashion.  A timeout was performed.  A solution containing 20 mL of liposomal bupivacaine  and 30 mL of 0.5% bupivacaine  was prepared.  It was used for local at the incision sites.  An incision was made in approximately the 7th interspace posterolaterally.  The  chest was entered bluntly using a hemostat.  The patient was on single-lung ventilation of the left lung which she tolerated well throughout the procedure.  An 8 mm robotic port was inserted.  The thoracoscope was advanced into the chest.  There was good  isolation of the right lung.  An 8 mm robotic port was placed via the 8th interspace along the anterior axillary line.  Additional 8 mm robotic ports were placed in the 3rd and 5th interspaces along the anterior axillary line.  Carbon dioxide was being  insufflated per protocol.  The robot was deployed.  The camera arm was docked.  Targeting was performed.  The remaining arms were docked.  The robotic instruments were inserted with thoracoscopic visualization.  The anatomy was well delineated.  The anterior mediastinal fat pad was identified.  The right phrenic nerve was easily visible throughout its course.  Dissection was begun inferiorly and then carried along the right phrenic nerve maintaining a good  margin and no cautery was used in vicinity of the nerve.  The entire dissection was done with bipolar cautery with the exception of larger veins which were divided with the SynchroSeal device.  The attachments to the pericardium were taken down with  bipolar cautery.  The pleura then was incised along the midline and the left pleural space was entered to allow positive identification of the left phrenic nerve.  The thymic fat pad was slightly larger on the left side than the right and it was  mobilized.  The dissection then was carried superiorly on the right side.  The superior pole of the thymus was identified.  The innominate vein was identified.  Gentle  traction was placed on the superior pole and it was dissected out and the vessels were  divided with the SynchroSeal device.  The dissection then carried along the innominate vein and there was a large thymic vein centrally that was divided with the SynchroSeal device as well.  The left superior pole then was mobilized in a similar fashion  and the remainder of the left-sided attachments were taken down.  There was good hemostasis at all sites.  Both phrenic nerves were completely intact.  There was good hemostasis throughout the operative field.  The sponge used during the dissection was  removed.  The thymus was placed into an 8 mm endoscopic retrieval bag and removed without difficulty.  The thymus was marked.  The inspection of the specimen revealed a cyst with relatively thick walled, opaque and not translucent.  It was completely  contained within the specimen and was sent for permanent pathology.  A 19-French Blake drain was placed through the most inferior incision and placed across the mediastinum.  Dual-lung ventilation was resumed.  The remaining incisions were closed in  standard fashion.  The patient was extubated in the operating room and taken to the postanesthesia care unit in good condition.  All sponge, needle and instrument counts were correct at the end of the procedure.   VAI D: 09/15/2024 6:36:26 pm T: 09/15/2024 9:48:00 pm  JOB: [id number]/ [id number]

## 2024-09-15 NOTE — Anesthesia Preprocedure Evaluation (Signed)
"                                    Anesthesia Evaluation  Patient identified by MRN, date of birth, ID band Patient awake    Reviewed: Allergy & Precautions, NPO status , Patient's Chart, lab work & pertinent test results  History of Anesthesia Complications Negative for: history of anesthetic complications  Airway Mallampati: II  TM Distance: >3 FB Neck ROM: Full    Dental  (+) Teeth Intact, Dental Advisory Given   Pulmonary neg shortness of breath, neg sleep apnea, neg COPD, neg recent URI   breath sounds clear to auscultation       Cardiovascular (-) hypertension(-) angina (-) Past MI + dysrhythmias  Rhythm:Regular     Neuro/Psych neg Seizures    GI/Hepatic Neg liver ROS,GERD  Controlled,,  Endo/Other  negative endocrine ROS    Renal/GU negative Renal ROS     Musculoskeletal negative musculoskeletal ROS (+)    Abdominal   Peds  Hematology negative hematology ROS (+)   Anesthesia Other Findings Mediastinal mass  Reproductive/Obstetrics                              Anesthesia Physical Anesthesia Plan  ASA: 2  Anesthesia Plan: General   Post-op Pain Management: Ofirmev  IV (intra-op)* and Toradol  IV (intra-op)*   Induction: Intravenous  PONV Risk Score and Plan: 4 or greater and Ondansetron , Dexamethasone  and Midazolam   Airway Management Planned: Double Lumen EBT  Additional Equipment: Arterial line  Intra-op Plan:   Post-operative Plan: Extubation in OR  Informed Consent: I have reviewed the patients History and Physical, chart, labs and discussed the procedure including the risks, benefits and alternatives for the proposed anesthesia with the patient or authorized representative who has indicated his/her understanding and acceptance.     Dental advisory given  Plan Discussed with: CRNA  Anesthesia Plan Comments:          Anesthesia Quick Evaluation  "

## 2024-09-15 NOTE — Brief Op Note (Addendum)
 09/15/2024 11:28 AM  3:57 PM  PATIENT:  Catherine Camacho  49 y.o. female  PRE-OPERATIVE DIAGNOSIS:  MEDIASTINAL CYST vs MASS  POST-OPERATIVE DIAGNOSIS:  MEDIASTINAL CYST  PROCEDURE: Xi ASSISTED RIGHT THORACOSCOPY,  THYMECTOMY  SURGEON:  Surgeons and Role:    1. Kerrin Elspeth BROCKS, MD - Primary    2. Rayfield Denmark, MD [facility] Resident  ANESTHESIA:   general  EBL:  Minimal  BLOOD ADMINISTERED:none  DRAINS: Feliciano drain placed in the right pleural space   LOCAL MEDICATIONS USED:  OTHER Exparel   SPECIMEN:  Source of Specimen:  Mediastinal mass  DISPOSITION OF SPECIMEN:  PATHOLOGY  COUNTS CORRECT:  YES  DICTATION: .Dragon Dictation  PLAN OF CARE: Admit to inpatient   PATIENT DISPOSITION:  PACU - hemodynamically stable.   Delay start of Pharmacological VTE agent (>24hrs) due to surgical blood loss or risk of bleeding: no

## 2024-09-15 NOTE — Anesthesia Procedure Notes (Signed)
 Procedure Name: Intubation Date/Time: 09/15/2024 2:10 PM  Performed by: Zelphia Norleen HERO, CRNAPre-anesthesia Checklist: Patient identified, Emergency Drugs available, Suction available and Patient being monitored Patient Re-evaluated:Patient Re-evaluated prior to induction Oxygen Delivery Method: Circle system utilized Preoxygenation: Pre-oxygenation with 100% oxygen Induction Type: IV induction Ventilation: Mask ventilation without difficulty Laryngoscope Size: Mac and 3 Grade View: Grade I Tube type: Oral Endobronchial tube: Double lumen EBT and EBT position confirmed by fiberoptic bronchoscope Number of attempts: 1 Airway Equipment and Method: Stylet Placement Confirmation: ETT inserted through vocal cords under direct vision, positive ETCO2 and breath sounds checked- equal and bilateral Tube secured with: Tape Dental Injury: Teeth and Oropharynx as per pre-operative assessment

## 2024-09-15 NOTE — Anesthesia Procedure Notes (Signed)
 Arterial Line Insertion Start/End1/05/2025 2:05 PM, 09/15/2024 2:07 PM Performed by: Zelphia Norleen HERO, CRNA, CRNA  Patient location: Pre-op. Preanesthetic checklist: patient identified, IV checked, site marked, risks and benefits discussed, surgical consent, monitors and equipment checked, pre-op evaluation, timeout performed and anesthesia consent Lidocaine  1% used for infiltration Right, radial was placed Catheter size: 20 G Hand hygiene performed  and maximum sterile barriers used   Attempts: 1 Following insertion, dressing applied. Post procedure assessment: normal and unchanged  Patient tolerated the procedure well with no immediate complications.

## 2024-09-15 NOTE — Discharge Instructions (Signed)
 Robot-Assisted Chest (Thoracic) Surgery: What to Know After After robot-assisted chest surgery, it's common to have some pain and aches in the area of your cuts from surgery. You may also have: Pain when breathing in and when coughing. Tiredness. Trouble sleeping. Trouble pooping (constipation). Follow these instructions at home: Medicines Take your medicines only as told. If you were given antibiotics, take them as told. Do not stop taking them even if you start to feel better. Talk with your health care provider about safe and effective ways to manage pain after your surgery. How you manage pain should fit your specific health needs. Take pain medicine before pain becomes severe. Controlling your pain will make breathing easier for you. Ask your provider if it's safe to drive or use machines while taking your medicine. Eating and drinking Eat and drink as told. This will vary depending on what procedure you had. Your provider may recommend: A liquid diet or soft diet for the first few days. Meals that are smaller and more frequent. Limiting foods that are high in fat and processed sugars, such as fried or sweet foods. You may also need to take these steps to help prevent or treat trouble pooping: Eat foods high in fiber, like beans, whole grains, and fresh fruits and vegetables. Drink more fluids as told. Incision care Take care of your cuts from surgery as told. Make sure you: Wash your hands with soap and water for at least 20 seconds before and after you change your bandage. If you can't use soap and water, use hand sanitizer. Change your bandage. Leave stitches or skin glue alone. Leave tape strips alone unless you're told to take them off. You may trim the edges of the tape strips if they curl up. Check the area around your cuts every day for signs of infection. Check for: Redness, swelling, or pain. Fluid or blood. Warmth. Pus or a bad smell. Activity Ask what things are  safe for you to do at home. Ask when you can go back to work or school. Ask your provider when it's safe for you to drive. Do not lift anything heavier than 10 lb (4.5 kg) until you're told it's OK. Rest as told. Get up to take short walks at least every 2 hours during the day. This helps you breathe better and keeps your blood flowing. Ask for help if you feel weak or unsteady. Exercise as told. Pneumonia prevention  Do deep breathing exercises and cough regularly as told. This helps clear mucus and opens your lungs. Doing this helps prevent lung infection (pneumonia). If you were given an incentive spirometer, use it as told. An incentive spirometer is a tool that measures how well you're filling your lungs with each breath. Do not smoke, vape, or use nicotine  or tobacco. Doing this can slow down healing. Avoid secondhand smoke. General instructions If you have a drainage tube: Follow instructions from your provider about how to take care of it. Do not travel by airplane after your tube is removed until your provider tells you it's safe. Your provider may give you more instructions. Make sure you know what you can and cannot do. Contact a health care provider if: You have any signs of infection. You have a fever. You throw up each time you eat or drink. Your pain medicine isn't controlling your pain. Get help right away if: You have chest pain. Your heart is beating fast. You have trouble breathing. You have trouble speaking. You are confused. You  feel weak or dizzy, or you faint. These symptoms may be an emergency. Call 911 right away. Do not wait to see if the symptoms will go away. Do not drive yourself to the hospital. This information is not intended to replace advice given to you by your health care provider. Make sure you discuss any questions you have with your health care provider. Document Revised: 01/28/2023 Document Reviewed: 01/28/2023 Elsevier Patient Education   2024 [employer].

## 2024-09-16 ENCOUNTER — Other Ambulatory Visit (HOSPITAL_COMMUNITY): Payer: Self-pay

## 2024-09-16 ENCOUNTER — Inpatient Hospital Stay (HOSPITAL_COMMUNITY)

## 2024-09-16 LAB — CBC
HCT: 34.4 % — ABNORMAL LOW (ref 36.0–46.0)
Hemoglobin: 11.9 g/dL — ABNORMAL LOW (ref 12.0–15.0)
MCH: 31.6 pg (ref 26.0–34.0)
MCHC: 34.6 g/dL (ref 30.0–36.0)
MCV: 91.5 fL (ref 80.0–100.0)
Platelets: 224 K/uL (ref 150–400)
RBC: 3.76 MIL/uL — ABNORMAL LOW (ref 3.87–5.11)
RDW: 13.2 % (ref 11.5–15.5)
WBC: 12.3 K/uL — ABNORMAL HIGH (ref 4.0–10.5)
nRBC: 0 % (ref 0.0–0.2)

## 2024-09-16 LAB — BASIC METABOLIC PANEL WITH GFR
Anion gap: 7 (ref 5–15)
BUN: 15 mg/dL (ref 6–20)
CO2: 24 mmol/L (ref 22–32)
Calcium: 8.3 mg/dL — ABNORMAL LOW (ref 8.9–10.3)
Chloride: 105 mmol/L (ref 98–111)
Creatinine, Ser: 0.81 mg/dL (ref 0.44–1.00)
GFR, Estimated: >60 mL/min
Glucose, Bld: 160 mg/dL — ABNORMAL HIGH (ref 70–99)
Potassium: 4.6 mmol/L (ref 3.5–5.1)
Sodium: 137 mmol/L (ref 135–145)

## 2024-09-16 MED ORDER — GABAPENTIN 300 MG PO CAPS
300.0000 mg | ORAL_CAPSULE | Freq: Every day | ORAL | 1 refills | Status: AC
  Filled 2024-09-16: qty 30, 30d supply, fill #0

## 2024-09-16 MED ORDER — ACETAMINOPHEN 500 MG PO TABS
500.0000 mg | ORAL_TABLET | Freq: Four times a day (QID) | ORAL | 0 refills | Status: AC | PRN
  Filled 2024-09-16: qty 30, 4d supply, fill #0

## 2024-09-16 MED ORDER — DIPHENHYDRAMINE HCL 25 MG PO CAPS: 25.0000 mg | ORAL_CAPSULE | Freq: Four times a day (QID) | ORAL | Status: AC | PRN

## 2024-09-16 MED ORDER — OXYCODONE HCL 5 MG PO TABS
5.0000 mg | ORAL_TABLET | ORAL | 0 refills | Status: AC | PRN
  Filled 2024-09-16: qty 30, 5d supply, fill #0

## 2024-09-16 NOTE — Progress Notes (Addendum)
" ° °   °  [address] [place] [address]             [phone]         1 Day Post-Op Procedures (LRB): EXCISION, MASS THYMECTOMY, MEDIASTINUM, ROBOT-ASSISTED (Right)  Subjective:  Patient doing well.  Has some pain in the upper area of her chest, responds to pain medication  Objective: Vital signs in last 24 hours: Temp:  [97.9 F (36.6 C)-98.6 F (37 C)] 98 F (36.7 C) (01/10 0740) Pulse Rate:  [77-99] 82 (01/10 0328) Cardiac Rhythm: Normal sinus rhythm (01/10 0700) Resp:  [10-20] 17 (01/10 0328) BP: (96-131)/(61-85) 117/77 (01/10 0740) SpO2:  [95 %-98 %] 98 % (01/10 0328) Arterial Line BP: (141-166)/(69-75) 166/75 (01/09 1630)  Intake/Output from previous day: 01/09 0701 - 01/10 0700 In: 2060.1 [P.O.:120; I.V.:1840.1; IV Piggyback:100] Out: 1650 [Urine:1650] Intake/Output this shift: Total I/O In: 240 [P.O.:240] Out: -   General appearance: alert, cooperative, and no distress Heart: regular rate and rhythm Lungs: clear to auscultation bilaterally Abdomen: soft, non-tender; bowel sounds normal; no masses,  no organomegaly Wound: clean and dry  Lab Results: Recent Labs    09/13/24 1500 09/16/24 0202  WBC 7.3 12.3*  HGB 14.5 11.9*  HCT 43.1 34.4*  PLT 289 224   BMET:  Recent Labs    09/13/24 1500 09/16/24 0202  NA 138 137  K 3.6 4.6  CL 102 105  CO2 25 24  GLUCOSE 88 160*  BUN 21* 15  CREATININE 0.83 0.81  CALCIUM 9.4 8.3*    PT/INR:  Recent Labs    09/13/24 1500  LABPROT 13.3  INR 1.0   ABG No results found for: PHART, HCO3, TCO2, ACIDBASEDEF, O2SAT CBG (last 3)  No results for input(s): GLUCAP in the last 72 hours.  Assessment/Plan: S/P Procedures (LRB): EXCISION, MASS THYMECTOMY, MEDIASTINUM, ROBOT-ASSISTED (Right)  CV- NSR, BP stable- on home propanolol Pulm- CT with no output, CXR w/o pneumothorax.. 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

## 2024-09-16 NOTE — Anesthesia Postprocedure Evaluation (Signed)
"   Anesthesia Post Note  Patient: Catherine Camacho  Procedure(s) Performed: EXCISION, MASS THYMECTOMY, MEDIASTINUM, ROBOT-ASSISTED (Right: Chest)     Patient location during evaluation: PACU Anesthesia Type: General Level of consciousness: awake and alert Pain management: pain level controlled Vital Signs Assessment: post-procedure vital signs reviewed and stable Respiratory status: spontaneous breathing, nonlabored ventilation and respiratory function stable Cardiovascular status: blood pressure returned to baseline and stable Postop Assessment: no apparent nausea or vomiting Anesthetic complications: no   No notable events documented.                [name]      "

## 2024-09-16 NOTE — Progress Notes (Signed)
" ° °   °  [address] [place] [address]             [phone]      CXR completed post tube removal.  Review shows stable apical space.  Patient follow up arranged with repeat CXR.  Okay for discharge  Rocky Shad, PA-C 1:20 PM 09/16/2024  "

## 2024-09-17 ENCOUNTER — Encounter (HOSPITAL_COMMUNITY): Payer: Self-pay | Admitting: Thoracic Surgery (Cardiothoracic Vascular Surgery)

## 2024-09-19 LAB — SURGICAL PATHOLOGY

## 2024-09-22 ENCOUNTER — Ambulatory Visit: Payer: Self-pay | Attending: Thoracic Surgery (Cardiothoracic Vascular Surgery) | Admitting: *Deleted

## 2024-09-22 DIAGNOSIS — Z4802 Encounter for removal of sutures: Secondary | ICD-10-CM

## 2024-09-22 NOTE — Progress Notes (Signed)
 Patient arrived for nurse visit to remove sutures post-robotic thymectomy resection 1/9 by Dr. Elspeth Millers.  One sutures removed with no signs or symptoms of infection noted. Incision well approximated. Patient tolerated suture removal well.  Patient instructed to keep the incision site clean and dry. Patient acknowledged instructions given.  All questions answered.

## 2024-10-03 ENCOUNTER — Other Ambulatory Visit: Payer: Self-pay | Admitting: Thoracic Surgery (Cardiothoracic Vascular Surgery)

## 2024-10-03 DIAGNOSIS — J9859 Other diseases of mediastinum, not elsewhere classified: Secondary | ICD-10-CM

## 2024-10-04 ENCOUNTER — Ambulatory Visit (HOSPITAL_COMMUNITY)
Admission: RE | Admit: 2024-10-04 | Discharge: 2024-10-04 | Disposition: A | Discharge status: Home / Self Care | Source: Ambulatory Visit | Attending: Cardiovascular Disease | Admitting: Cardiovascular Disease

## 2024-10-04 ENCOUNTER — Ambulatory Visit
Payer: Self-pay | Attending: Thoracic Surgery (Cardiothoracic Vascular Surgery) | Admitting: Thoracic Surgery (Cardiothoracic Vascular Surgery)

## 2024-10-04 VITALS — BP 117/77 | HR 72 | Resp 20 | Ht 69.0 in | Wt 166.0 lb

## 2024-10-04 DIAGNOSIS — J9859 Other diseases of mediastinum, not elsewhere classified: Secondary | ICD-10-CM

## 2024-10-04 DIAGNOSIS — Z9889 Other specified postprocedural states: Secondary | ICD-10-CM

## 2024-10-04 NOTE — Progress Notes (Signed)
 "  [address], [place] [address]             [phone]    HPI: [name] returns for a scheduled postoperative follow-up after recent thymectomy.  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  She had an MRI but it was unable to completely characterize the cyst.  She underwent robotic assisted thymectomy on 09/15/2024.  She went home the following day.  She took oxycodone  for about the first 3 to 4 days but has not had any since.  Still taking Tylenol .  Does have pain when she sneezes and is not yet comfortable wearing a bra but otherwise pain-free.  Past Medical History:  Diagnosis Date   ADHD (attention deficit hyperactivity disorder)    no meds   Anxiety    Dysrhythmia    Palpitations     Current Outpatient Medications  Medication Sig Dispense Refill   acetaminophen  (TYLENOL ) 500 MG tablet Take 1-2 tablets (500-1,000 mg total) by mouth every 6 (six) hours as needed. 30 tablet 0   diphenhydrAMINE  (BENADRYL ) 25 mg capsule Take 1 capsule (25 mg total) by mouth every 6 (six) hours as needed for itching.     estradiol (VIVELLE-DOT) 0.0375 MG/24HR Place 1 patch onto the skin 2 (two) times a week.     gabapentin  (NEURONTIN ) 300 MG capsule Take 1 capsule (300 mg total) by mouth at bedtime. 30 capsule 1   Multiple Vitamins-Minerals (AIRBORNE PO) Take 1 Dose by mouth daily.     oxyCODONE  (OXY IR/ROXICODONE ) 5 MG immediate release tablet Take 1 tablet (5 mg total) by mouth every 4 (four) hours as needed for moderate pain (pain score 4-6). 30 tablet 0   progesterone  (PROMETRIUM ) 100 MG capsule Take 200 mg by mouth at bedtime.     propranolol  (INDERAL ) 10 MG tablet Take 1 tablet (10 mg total) by mouth 3 (three) times daily as needed (palpitations). 90 tablet 1   TESTOSTERONE TD Apply 1 Application topically at bedtime. 2% cream     No current facility-administered  medications for this visit.    Physical Exam BP 117/77   Pulse 72   Resp 20   Ht 5' 9 (1.753 m)   Wt 166 lb (75.3 kg)   SpO2 97% Comment: RA  BMI 24.13 kg/m  49 year old woman in no acute distress Alert and oriented x 3 with no focal deficits Lungs clear with equal breath sounds bilaterally Incisions healing well Cardiac regular rate and rhythm  Diagnostic Tests: Chest x-ray shows good aeration of the lungs bilaterally with no effusions.  A. 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  Background benign thymic tissue.  Negative for malignancy.   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  She underwent robotic assisted thymectomy on 09/15/2024.  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  She is doing extremely well.  Still has some discomfort which is to be expected but overall is ahead of most at this stage of the game.  Not requiring narcotics.  She may gradually resume activities.  She wants to return to work.  I told her she can go back starting next Monday but should only work half days for  the first 2 weeks.  If she is doing well at that point she can go back full-time.  No follow-up necessary for benign thymic cyst.  Plan: I will be happy to see her back anytime in the future if I can be of any further assistance with her care She knows to call if she has any problems related to her procedure.  Elspeth JAYSON Millers, MD [facility] [phone]     "

## 2024-10-06 ENCOUNTER — Encounter: Payer: Self-pay | Admitting: *Deleted

## 2024-10-18 ENCOUNTER — Encounter: Payer: Managed Care, Other (non HMO) | Admitting: Family Medicine
